# Patient Record
Sex: Female | Born: 1980
Health system: Southern US, Community
[De-identification: ages and names within clinical notes are randomized; demographics above are authoritative.]

## PROBLEM LIST (undated history)

## (undated) DIAGNOSIS — R112 Nausea with vomiting, unspecified: Secondary | ICD-10-CM

## (undated) DIAGNOSIS — E039 Hypothyroidism, unspecified: Secondary | ICD-10-CM

## (undated) DIAGNOSIS — J189 Pneumonia, unspecified organism: Secondary | ICD-10-CM

## (undated) DIAGNOSIS — E079 Disorder of thyroid, unspecified: Secondary | ICD-10-CM

## (undated) DIAGNOSIS — G43909 Migraine, unspecified, not intractable, without status migrainosus: Secondary | ICD-10-CM

## (undated) DIAGNOSIS — K219 Gastro-esophageal reflux disease without esophagitis: Secondary | ICD-10-CM

## (undated) DIAGNOSIS — N809 Endometriosis, unspecified: Secondary | ICD-10-CM

## (undated) DIAGNOSIS — R42 Dizziness and giddiness: Secondary | ICD-10-CM

## (undated) DIAGNOSIS — Z9889 Other specified postprocedural states: Secondary | ICD-10-CM

## (undated) DIAGNOSIS — J45909 Unspecified asthma, uncomplicated: Secondary | ICD-10-CM

## (undated) DIAGNOSIS — E785 Hyperlipidemia, unspecified: Secondary | ICD-10-CM

## (undated) DIAGNOSIS — H8110 Benign paroxysmal vertigo, unspecified ear: Secondary | ICD-10-CM

## (undated) DIAGNOSIS — E7800 Pure hypercholesterolemia, unspecified: Secondary | ICD-10-CM

## (undated) HISTORY — PX: REDUCTION MAMMAPLASTY: SUR839

## (undated) HISTORY — PX: KNEE ARTHROSCOPY: SUR90

## (undated) HISTORY — PX: BREAST REDUCTION SURGERY: SHX8

## (undated) HISTORY — DX: Dizziness and giddiness: R42

## (undated) HISTORY — DX: Hyperlipidemia, unspecified: E78.5

## (undated) HISTORY — DX: Benign paroxysmal vertigo, unspecified ear: H81.10

## (undated) HISTORY — DX: Gastro-esophageal reflux disease without esophagitis: K21.9

## (undated) HISTORY — PX: OTHER SURGICAL HISTORY: SHX169

## (undated) HISTORY — DX: Hypothyroidism, unspecified: E03.9

## (undated) HISTORY — PX: SINUS SURGERY WITH INSTATRAK: SHX5215

## (undated) HISTORY — DX: Migraine, unspecified, not intractable, without status migrainosus: G43.909

## (undated) HISTORY — PX: BILATERAL KNEE ARTHROSCOPY: SUR91

## (undated) HISTORY — PX: TOTAL ABDOMINAL HYSTERECTOMY: SHX209

## (undated) HISTORY — DX: Pneumonia, unspecified organism: J18.9

## (undated) HISTORY — PX: CARPAL TUNNEL RELEASE: SHX101

---

## 2003-07-19 ENCOUNTER — Emergency Department (HOSPITAL_COMMUNITY): Admission: EM | Admit: 2003-07-19 | Discharge: 2003-07-19 | Payer: Self-pay | Admitting: Emergency Medicine

## 2003-10-27 ENCOUNTER — Ambulatory Visit (HOSPITAL_COMMUNITY): Admission: RE | Admit: 2003-10-27 | Discharge: 2003-10-27 | Payer: Self-pay | Admitting: Obstetrics & Gynecology

## 2004-03-27 ENCOUNTER — Emergency Department (HOSPITAL_COMMUNITY): Admission: EM | Admit: 2004-03-27 | Discharge: 2004-03-27 | Payer: Self-pay | Admitting: Emergency Medicine

## 2005-04-24 ENCOUNTER — Ambulatory Visit (HOSPITAL_COMMUNITY): Admission: RE | Admit: 2005-04-24 | Discharge: 2005-04-24 | Payer: Self-pay | Admitting: Family Medicine

## 2005-05-02 ENCOUNTER — Ambulatory Visit (HOSPITAL_COMMUNITY): Admission: RE | Admit: 2005-05-02 | Discharge: 2005-05-02 | Payer: Self-pay | Admitting: Obstetrics & Gynecology

## 2005-06-16 ENCOUNTER — Encounter (HOSPITAL_COMMUNITY): Admission: RE | Admit: 2005-06-16 | Discharge: 2005-07-16 | Payer: Self-pay | Admitting: Endocrinology

## 2006-06-16 ENCOUNTER — Ambulatory Visit (HOSPITAL_BASED_OUTPATIENT_CLINIC_OR_DEPARTMENT_OTHER): Admission: RE | Admit: 2006-06-16 | Discharge: 2006-06-16 | Payer: Self-pay | Admitting: Orthopedic Surgery

## 2006-08-18 ENCOUNTER — Ambulatory Visit (HOSPITAL_BASED_OUTPATIENT_CLINIC_OR_DEPARTMENT_OTHER): Admission: RE | Admit: 2006-08-18 | Discharge: 2006-08-18 | Payer: Self-pay | Admitting: Orthopedic Surgery

## 2007-01-11 ENCOUNTER — Ambulatory Visit (HOSPITAL_COMMUNITY): Admission: RE | Admit: 2007-01-11 | Discharge: 2007-01-11 | Payer: Self-pay | Admitting: Obstetrics and Gynecology

## 2007-01-11 ENCOUNTER — Encounter (INDEPENDENT_AMBULATORY_CARE_PROVIDER_SITE_OTHER): Payer: Self-pay | Admitting: Specialist

## 2007-04-15 ENCOUNTER — Encounter: Payer: Self-pay | Admitting: Obstetrics and Gynecology

## 2007-04-15 ENCOUNTER — Inpatient Hospital Stay (HOSPITAL_COMMUNITY): Admission: RE | Admit: 2007-04-15 | Discharge: 2007-04-17 | Payer: Self-pay | Admitting: Obstetrics and Gynecology

## 2007-09-22 ENCOUNTER — Emergency Department (HOSPITAL_COMMUNITY): Admission: EM | Admit: 2007-09-22 | Discharge: 2007-09-22 | Payer: Self-pay | Admitting: Emergency Medicine

## 2007-09-23 ENCOUNTER — Ambulatory Visit (HOSPITAL_COMMUNITY): Admission: RE | Admit: 2007-09-23 | Discharge: 2007-09-23 | Payer: Self-pay | Admitting: Family Medicine

## 2007-09-27 ENCOUNTER — Ambulatory Visit: Payer: Self-pay | Admitting: Orthopedic Surgery

## 2007-09-27 DIAGNOSIS — M25819 Other specified joint disorders, unspecified shoulder: Secondary | ICD-10-CM | POA: Insufficient documentation

## 2007-09-27 DIAGNOSIS — M7580 Other shoulder lesions, unspecified shoulder: Secondary | ICD-10-CM

## 2008-01-24 ENCOUNTER — Ambulatory Visit (HOSPITAL_COMMUNITY): Admission: RE | Admit: 2008-01-24 | Discharge: 2008-01-24 | Payer: Self-pay | Admitting: Family Medicine

## 2008-05-19 ENCOUNTER — Ambulatory Visit (HOSPITAL_COMMUNITY): Admission: RE | Admit: 2008-05-19 | Discharge: 2008-05-19 | Payer: Self-pay | Admitting: Family Medicine

## 2008-08-29 ENCOUNTER — Encounter (INDEPENDENT_AMBULATORY_CARE_PROVIDER_SITE_OTHER): Payer: Self-pay | Admitting: Plastic Surgery

## 2008-08-29 ENCOUNTER — Ambulatory Visit (HOSPITAL_BASED_OUTPATIENT_CLINIC_OR_DEPARTMENT_OTHER): Admission: RE | Admit: 2008-08-29 | Discharge: 2008-08-29 | Payer: Self-pay | Admitting: Plastic Surgery

## 2008-11-22 ENCOUNTER — Ambulatory Visit (HOSPITAL_COMMUNITY): Admission: RE | Admit: 2008-11-22 | Discharge: 2008-11-22 | Payer: Self-pay | Admitting: Internal Medicine

## 2010-01-30 ENCOUNTER — Ambulatory Visit (HOSPITAL_COMMUNITY): Admission: RE | Admit: 2010-01-30 | Discharge: 2010-01-30 | Payer: Self-pay | Admitting: Internal Medicine

## 2011-01-21 NOTE — H&P (Signed)
[name], [name]                ACCOUNT NO.:  [id number]   MEDICAL RECORD NO.:  [id number]          PATIENT TYPE:  AMB   LOCATION:  DAY                           FACILITY:  [facility]   PHYSICIAN:  Tilda Burrow, M.D. DATE OF BIRTH:  [birth year]   DATE OF ADMISSION:  DATE OF DISCHARGE:  LH                              HISTORY & PHYSICAL   ADMISSION DIAGNOSES:  1. Severe endometriosis with uterosacral ligament involvement.  2. Pelvic pain secondary to endometriosis.   HISTORY OF PRESENT ILLNESS:  This 30 year old female was admitted for  abdominal hysterectomy and bilateral salpingo-oophorectomy. [name] has  been seen in our office for chronic back pain. She recently underwent  laparoscopy in May for pelvic pain and uterine retroversion. She is a  gravida 1, para 1, status post cesarean section. Laparoscopy revealed  severe endometriosis of the cul-de-sac with some effort made to release  the uterosacral ligaments. She was suppressed with Lupron for 2 months.  Consideration was being given for her to stay on the Lupron for 3 months  and then attempt pregnancy. Unfortunately, her pain levels have not  improved on the Lupron, and at this point, she finds the pain too  debilitating to consider delay and attempt at pregnancy. We are,  therefore, as per patient request heading towards an abdominal  hysterectomy with removal and tubes and ovaries and dissection of as  much of the endometriosis of the cul-de-sac as possible. Dr. Rito Ehrlich is  being called to assist to place ureteral stent before the procedure to  try to address the safety issues regarding the ureters. The patient will  have a bowel prep the day before to reduce bowel contents. The patient  is aware that it may not be possible to remove all of the endometriosis  and that residual discomfort is a possibility. Ovarian preservation is  considered undesirable due to the lack of recurrent persistent  endometriosis pain.   PAST  MEDICAL HISTORY:  Hypothyroidism managed by Dr. Morayati__________  .   SURGICAL HISTORY:  1. Cesarean section x1.  2. Laparoscopy on May 2008.   ALLERGIES:  BENADRYL, SUDAFED, DARVOCET, VICODIN, VOLTAREN, CODEINE AND  LODINE.   GENERAL EXAM:  Shows an average-weight Caucasian female, weight 160.  Blood pressure 120/62, pulse 72.  Pupils are equal, round, and reactive.  NECK:  Supple.  CHEST:  Clear to auscultation.  ABDOMEN:  Nontender with well-healed incision. There is some suprapubic  discomfort on palpation. Bimanual exam shows a retroverted uterus with  tenderness and nodularity in the cul-de-sac, even though she is on  Lupron.  Vaginal tissues are atrophic.   IMPRESSION:  1. Severe endometriosis.  2. Pelvic pain secondary to endometriosis.   PLAN:  TAH/BSO after placement of ureteral stents on April 15, 2007.      Tilda Burrow, M.D.  Electronically Signed     JVF/MEDQ  D:  04/14/2007  T:  04/14/2007  Job:  [id number]   cc:   [name], M.D.  Fax: [phone]

## 2011-01-21 NOTE — Op Note (Signed)
[name], [name]                ACCOUNT NO.:  [id number]   MEDICAL RECORD NO.:  [id number]          PATIENT TYPE:  INP   LOCATION:  A340                          FACILITY:  [facility]   PHYSICIAN:  Tilda Burrow, M.D. DATE OF BIRTH:  1980-10-01   DATE OF PROCEDURE:  DATE OF DISCHARGE:                               OPERATIVE REPORT   PREOPERATIVE DIAGNOSIS:  Pelvic pain, severe endometriosis with  uterosacral ligament involvement.   POSTOPERATIVE DIAGNOSIS:  Pelvic pain, severe endometriosis with  uterosacral ligament involvement.   PROCEDURE:  Total abdominal hysterectomy, bilateral salpingo-  oophorectomy.  Additionally, ureteral stent placement by Dr. Rito Ehrlich,  dictated elsewhere.   SURGEON:  Tilda Burrow, M.D.   ASSISTANT:  Dr. Rito Ehrlich.   ANESTHESIA:  General.   COMPLICATIONS:  None.   FINDINGS:  Fibrotic endometriosis, particularly of the left uterosacral  ligament.   DESCRIPTION OF PROCEDURE:  Patient was taken to the operating room,  prepped and draped for combined abdominal and vaginal procedure.  Dr.  Rito Ehrlich proceeded with ureteral stents, as per his dictation.   The patient's legs were placed down in a low lithotomy position, and  Pfannenstiel incision repeated.  The old C-section scar was widely  excised, excising a 6 cm wide x 20 cm long ellipse of skin and fatty  tissue.  The remaining subcu fatty tissue was transected, and the fascia  identified and opened in a standard Pfannenstiel technique.  There was a  lot of fibrous adhesions from the fascia to the rectus muscles.  Careful  hemostasis was achieved.  The peritoneal cavity was entered carefully in  the midline using hemostats to elevate the peritoneum and Metzenbaum  scissors to dissect.  The peritoneal cavity was once entered, and there  were no adhesions to the anterior abdominal wall.  Bowel was normal in  appearance on its surfaces.   Plastic abdominal wall retractor was positioned in  placed, and the  pelvis emptied of bowel contents while elevating the bowel and using  moistened laps.  The uterus could be elevated and was a small uterus  with atrophic ovaries while she is on Lupron.  The left uterosacral  ligament was particularly well involved with endometriosis, but the rest  of the pelvis appeared grossly normal.  The ureters could be palpated  out of the surgical area, but this was about 1.5 cm on the left and a  little more on the right.  Round ligaments were taken down, ligated,  transected, and found to be hemostatic.  The bladder flap was developed  anteriorly without difficulty, and the correct plane identified.  There  was some vascularity in the vesicouterine reflection of the peritoneum  and point cautery was used as carefully as necessary to achieve  hemostasis in this area.  The infundibulopelvic ligaments were isolated  on either side, being careful to palpate the ureters, as we did so and  then transected using 0 chromic suture ligature.  The broad ligament was  skeletonized down to the uterine vessels, transected using curved Heaney  clamp, Mayo scissors transection, and 0 chromic suture  ligature.  The  upper and lower cardinal ligaments were serially clamped, cut, and  suture ligated using straight Heaney clamps, Mayo scissor transection,  and 0 chromic suture ligature.  Once the level of the cervix was felt  opened and reached, and the bladder flap had been placed well out of the  way, an anterior stab incision in the cervical vaginal fornix was  achieved, and then Kocher clamps were used to grasp the vaginal cuff  edges as we amputated the cervix off the vaginal cuff.  A posterior tuck  was made in the posterior rim of the vaginal cuff tissue to give an  effective lengthening of the vagina, then the remainder of the cuff was  closed by placing interrupted 0 chromic sutures across the cuff  transversely.  Hemostasis was quite good at the completion  of this.  The  cuff apex was attached to the lower cardinal ligaments on either side by  this process.   There was a small bit of hemostasis that required additional suturing x1  on the left side of the vesicouterine reflection of peritoneum.  This  was achieved with a single suture of 2-0 chromic.  The ureter again and  the bladder were serially inspected prior to and after placement of the  suture for confirmation of its position.   The peritoneum was loosely reapproximated with interrupted 2-0 chromic  x3 across the pelvic floor after irrigation and inspection of the pelvis  for hemostasis.   The uterosacral ligament on the left side required special attention.  It was densely adherent to the cuff and after we had identified the left  ureter and the surface of the bowel and convinced ourselves that both  were well out of harm's way, the uterosacral ligament on this side was  trimmed from the cuff enough to march down and remove approximately 1.5  cm long x 1 cm x 5 mm thick area of fibrotic tissue from the surface of  the uterosacral ligament, improving mobility on this side and throughout  the process.  We maintained appreciation of the bowel and ureter to be  sure that we did not suspect any problems.  The peritoneum over this  defect was closed side to side with good hemostasis using 2-0 chromic  interrupted sutures.   We then proceeded with the peritoneal closure mentioned earlier.   Laparotomy equipment was removed.  The pelvis confirmed once again it  was adequately hemostatic.  The anterior peritoneum was closed with  running 2-0 chromic.  Rectus muscles were naturally closed enough  together that no suturing was required.  The fascia was then closed in a  running 0 Vicryl fashion with good tissue edges approximating.  The  subcu fatty tissues were irrigated with saline solution with two layers  of interrupted 2-0 plain sutures pulling tissues together and staple  closure  of the skin completing the procedure.  The patient tolerated the  procedure well and went to the recovery room in good condition.  Sponge  and needle counts are correct.   ADDENDUM:  A Jackson-Pratt drain was placed in the subcu space and  allowed to exit through the left side of the incision.  Additionally, at  the end of the case, the ureteral stents replaced by Dr. Rito Ehrlich  earlier were removed by me and were visually inspected and appeared to  be intact.      Tilda Burrow, M.D.  Electronically Signed     JVF/MEDQ  D:  04/15/2007  T:  04/15/2007  Job:  [id number]   cc:   Dennie Maizes, M.D.  Fax: [phone]

## 2011-01-21 NOTE — Op Note (Signed)
[name], [name]                ACCOUNT NO.:  [id number]   MEDICAL RECORD NO.:  [id number]          PATIENT TYPE:  INP   LOCATION:  A340                          FACILITY:  [facility]   PHYSICIAN:  Dennie Maizes, M.D.   DATE OF BIRTH:  05/13/1981   DATE OF PROCEDURE:  04/15/2007  DATE OF DISCHARGE:                               OPERATIVE REPORT   PREOPERATIVE DIAGNOSIS:  Pelvic pain, extensive endometriosis.   POSTOPERATIVE DIAGNOSIS:  Pelvic pain, extensive endometriosis.   OPERATIVE PROCEDURE:  Cystoscopy and bilateral ureteral catheter  insertion.   ANESTHESIA:  General.   SURGEON:  Dennie Maizes, M.D.   COMPLICATIONS:  None.   ESTIMATED BLOOD LOSS:  None.   DRAINS:  16-French Foley catheter in the bladder, 5-French ureteral  catheters x2.   INDICATIONS FOR PROCEDURE:  This 30 year old female had pelvic pain  associated with extensive endometriosis.  She had a prior history of C-  section.  Dr. Emelda Fear planned to do total abdominal hysterectomy with  bilateral salpingo-oophorectomy.  Dr. Emelda Fear asked me to do cystoscopy  and place bilateral ureteral catheters for intraoperative localization  of the ureters.   DESCRIPTION OF PROCEDURE:  General anesthesia was induced and the  patient was placed on the OR table in the dorsal lithotomy position.  The lower abdomen and genitalia were prepped and draped in a sterile  fashion.  Urethral meatal stenosis was noted.  Urethral dilation was  done up to 28-French.  Cystoscopy was done with a 25-French scope.  Appearance of bladder was normal.  The trigone ureteral orifice and  bladder mucosa were unremarkable.  A 5-French whistle-tip catheter was  then placed on the left side up to level of 22 cm without any  difficulty.  A second catheter was placed on the right side.  The  cystoscope was removed.  A 16-French Foley catheter was inserted into  the bladder.  The ureteral catheter was affixed to the Foley catheter  using a silk  tie and  tape.  Dr. Emelda Fear then proceeded with total abdominal hysterectomy,  bilateral salpingo-oophorectomy.  I assisted him for this procedure.   It is planed to remove the ureteral catheters at the conclusion  operation.      Dennie Maizes, M.D.  Electronically Signed     SK/MEDQ  D:  04/15/2007  T:  04/15/2007  Job:  [id number]   cc:   Tilda Burrow, M.D.  Fax: [phone]

## 2011-01-21 NOTE — Discharge Summary (Signed)
[name], [name]                ACCOUNT NO.:  [id number]   MEDICAL RECORD NO.:  [id number]          PATIENT TYPE:  INP   LOCATION:  A340                          FACILITY:  [facility]   PHYSICIAN:  Lazaro Arms, M.D.   DATE OF BIRTH:  [birth year]   DATE OF ADMISSION:  04/15/2007  DATE OF DISCHARGE:  08/09/2008LH                               DISCHARGE SUMMARY   DISCHARGE DIAGNOSES:  1. Status post total abdominal hysterectomy and bilateral salpingo-      oophorectomy.  2. Severe endometriosis.  3. Unremarkable postoperative course.   PROCEDURE:  Abdominal hysterectomy with removal of both tubes and  ovaries.   Please refer to the History and Physical for details of admission to the  hospital as well as the operative report.   POSTOPERATIVE COURSE.:  The patient did well.  She tolerated clear  liquids to regular diet.  She was without symptoms.  Her abdominal exam  was benign.  Her incision was clean, dry and intact.  The Jackson-Pratt  drain was putting out serosanguineous fluid minimally. She was  ambulating without difficulty and tolerating oral pain medicine.  She  had a small amount of nausea for which she was taking some oral  Phenergan but otherwise doing well.  Her postoperative hemoglobin and  hematocrit were 11.5 and 33.3, respectively, which was appropriate  minimal drop. She was discharged to home on the morning of postoperative  day #2 in good and stable condition.  She will follow up in the office  as scheduled the following Wednesday.  She was given prescriptions for:  1. Percocet 5/325, #40.  2. Motrin 800 mg, #20.  3. Phenergan 25 mg, #12.  4. She will continue her other routine medications at home.   Pathology report is pending.   She is given instructions on precautions with p.r.n. return prior to  that time.      Lazaro Arms, M.D.  Electronically Signed     LHE/MEDQ  D:  04/17/2007  T:  04/18/2007  Job:  [id number]

## 2011-01-21 NOTE — Op Note (Signed)
[name], [name]                ACCOUNT NO.:  [id number]   MEDICAL RECORD NO.:  [id number]          PATIENT TYPE:  AMB   LOCATION:  DSC                          FACILITY:  [facility]   PHYSICIAN:  Mary Contogiannis, M.D.DATE OF BIRTH:  July 30, 1981   DATE OF PROCEDURE:  08/29/2008  DATE OF DISCHARGE:                               OPERATIVE REPORT   PREOPERATIVE DIAGNOSIS:  Bilateral macromastia.   POSTOPERATIVE DIAGNOSIS:  Bilateral macromastia.   PROCEDURE:  Bilateral reduction mammoplasty.   ATTENDING SURGEON:  Brantley Persons, MD   ANESTHESIA:  General.   ANESTHESIOLOGIST:  Dr. Sondra Come.   ESTIMATED BLOOD LOSS:  200 mL.   FLUID REPLACEMENT:  2900 mL crystalloid.   URINE OUTPUT:  1000 mL.   COMPLICATIONS:  None.   INDICATIONS FOR PROCEDURE:  The patient is a 30 year old Caucasian  female, who has bilateral macromastia that is clinically symptomatic.  She presents to undergo bilateral reduction mammoplasties.   PROCEDURE:  The patient was marked in the preop holding area in the  pattern of Wise for the future bilateral reduction mammoplasties.  She  was then taken back to the OR and placed on the table in a supine  position.  After adequate general endotracheal anesthesia was obtained,  the patient's chest was prepped with Technicare and draped in sterile  fashion.  The base of the breasts were then injected with 1% lidocaine  with epinephrine.  After adequate hemostasis and anesthesia taken  effect, the procedure was begun.   Both of the breast reductions were performed in the following similar  manner.  The nipple-areolar complex was marked with the 45-mm nipple  marker.  This was then incised and the skin was de-epithelialized around  the nipple-areolar complex down to the inframammary crease in inferior  pedicle pattern.  Next, the medial, superior, and lateral skin flaps  were elevated down to the chest wall.  The excess fat and glandular  tissue were removed from the  inferior pedicle.  The nipple-areolar  complex was examined and found to be pink and viable.  The wound was  then examined and meticulous hemostasis was obtained with the Bovie  electrocautery.  A #10 JP flat fully-fluted drain was placed into the  wound.  The skin flaps were brought together in the inverted T junction  with a 2-0 Prolene suture.  The incisions were stapled for temporary  closure.  The breasts compared and found to have good shape and  symmetry.  The incisions were then closed from the medial aspect of the  JP drain to the medial aspect of the [facility] incision by first placing a few  3-0 Monocryl sutures in the dermal layer and then both the dermal and  cuticular layers were closed in a single layer using a 2-0 Quill PDO  barbed suture.  Lateral to the JP drain, incision was closed using 3-0  Monocryl in the dermal layer followed by a 3-0 Monocryl running  intracuticular stitch on the skin.   The patient was then placed in the upright position.  The future  location of the nipple-areolar complexes were  marked on both breast  mounds using the 45-mm nipple marker.  She was then placed back in the  recumbent position.    Both of the nipple-areolar complexes were then brought out onto the  breast mounds in the following similar manner.  The skin was incised as  marked and a tissue removed full thickness through the skin and soft  tissues.  The nipple-areolar complex was examined, found to be pink and  viable, then brought out through this aperture and sewn in place using 4-  0 Monocryl in the dermal layer followed by a 5-0 Monocryl suture in the  cuticular layer.  The vertical limb of the Wise pattern was closed using  3-0 Monocryl in the dermal layer followed by a 5-0 Monocryl suture for  the cuticular layer that was brought down from the nipple-areolar  complex closure.  The JP drain was sewn in place using 3-0 nylon suture.  The base of the breasts, skin, and soft tissues  were then infiltrated  with 0.5% Marcaine with epinephrine to provide a postoperative surgical  block.  The incisions were dressed with Benzoin and Steri-Strips and the  nipples additionally with bacitracin ointment and Adaptic.  A 4x4s were  then placed over the incisions and the patient was placed into a light  postoperative support bra.  There were no complications.  The patient  tolerated procedure well.  The final needle and sponge counts report to  be correct at the end of the case.   The patient was then awakened from the general anesthesia and taken to  the recovery room in stable condition.  She was then recovered without  complications.  The patient decided she would rather go home than stay  overnight, this was appropriate to me.  Both the patient and her husband  were given proper postoperative wound care instructions including care  of the JP drains.  The patient was then discharged home in the care of  her husband in stable condition.  Followup appointment will be in a  couple of days in the office.           ______________________________  Brantley Persons, M.D.     MC/MEDQ  D:  08/29/2008  T:  08/30/2008  Job:  [id number]

## 2011-01-24 NOTE — Op Note (Signed)
[name], [name]                ACCOUNT NO.:  [id number]   MEDICAL RECORD NO.:  [id number]          PATIENT TYPE:  AMB   LOCATION:  DAY                           FACILITY:  [facility]   PHYSICIAN:  Tilda Burrow, M.D. DATE OF BIRTH:  Jun 19, 1981   DATE OF PROCEDURE:  01/11/2007  DATE OF DISCHARGE:                               OPERATIVE REPORT   PREOPERATIVE DIAGNOSIS:  Pelvic pain, uterine retroversion.   POSTOPERATIVE DIAGNOSIS:  Pelvic pain, uterine retroversion,  endometriosis __________.   OPERATION PERFORMED:  Diagnostic laparoscopy, peritoneal biopsies.   SURGEON:  Tilda Burrow, M.D.   ASSISTANT:  None.   ANESTHESIA:  General.   COMPLICATIONS:  None.   FINDINGS:  Multiple tiny 1 to 2 mm points of dark endometriosis,  particularly involving the left uterosacral ligament and right  uterosacral ligament to a lesser degree with several small implants on  the bladder flap.  No visible endometriosis involving either tube or  either ovary.   DESCRIPTION OF PROCEDURE:  The patient was taken to the operating room,  prepped and draped in the usual fashion for the combined abdominal and  vaginal procedure with uterine manipulator in position.  Foley catheter  in place using hypoallergenic equipment, and then we proceeded with  laparoscopy.  After the patient was prepped and draped, an  infraumbilical vertical 1 cm skin incision was made through the  abdominal wall.  The sacral promontory was palpable through the  abdominal wall and given the patient's slim body habitus, we were  particularly careful to retract the abdominal wall caudad to ensure that  the peritoneal entry of the Veress needle was into the pelvic bowl.  Water droplet test confirmed intraperitoneal location and the  pneumoperitoneum was easily achieved under 10 mmHg pressure.  The 10 mm  umbilical laparoscopic trocar was introduced under direct visualization  and the pelvis inspected and no evidence of  bleeding or trauma  suspected.  The suprapubic trocar was introduced under direct  visualization.  The patient was placed in the Trendelenburg position and  the pelvis was inspected.  After noticing the bowel was grossly normal  without abnormalities, the bladder flap was found to have multiple small  1 mm dark implants of endometriosis over its surface.  The old cesarean  section scar showed some retraction and fibrosis either representing  postsurgical changes or endometriosis.  Looking behind the uterus, there  was a small amount of adhesions just inside the left uterosacral  ligament, that was causing some traction on the peritoneum, and then the  left uterosacral ligament was particularly involved in a dark fibrotic  area with surrounding peritoneal thickening.  The right uterosacral  ligament was less involved.  Ureters could be identified at the pelvic  brim and appeared to dive into the sidewall out of the way of the  endometriosis.  The left uterosacral ligament was then addressed first  with the suprapubic trocar in the third trocar site to the right lower  quadrant used to identify the fibrotic endometriosis on the surface of  the uterosacral.  A strip of peritoneum  was removed just medial to the  uterosacral and then peritoneal surface over the uterosacral ligament on  the left side elevated and trimmed, improving the laxity on this left  side.  Specimen was taken off.  There was no suspected injury to  adjacent organs or retroperitoneal structures.  Attempts to biopsy the  dome of the bladder were not particularly successful, but a releasing  incision could be made on the side of each endometrial implant to avoid  the tendencies toward retraction.  The patient tolerated the procedure  well. Pelvis was irrigated, approximately 200 mL saline left in the  abdomen, abdomen deflated, the umbilical site closed at the fascial  level with 0-0 Vicryl.  All three sites closed using 3-0  Vicryl  subcutaneously.  The patient then was allowed to go to recovery room in  good condition.  Sponge and needle counts were correct.   ADDENDUM:  The patient will be placed on Lupron post surgically until  pregnancy is desired.      Tilda Burrow, M.D.  Electronically Signed     JVF/MEDQ  D:  01/11/2007  T:  01/11/2007  Job:  [id number]

## 2011-01-24 NOTE — H&P (Signed)
NAMEJADORE, MCGUFFIN                ACCOUNT NO.:  [id number]   MEDICAL RECORD NO.:  [id number]           PATIENT TYPE:  AMB   LOCATION:  DAY                           FACILITY:  [facility]   PHYSICIAN:  Tilda Burrow, M.D. DATE OF BIRTH:  27-Jul-1981   DATE OF ADMISSION:  01/11/2007  DATE OF DISCHARGE:  LH                              HISTORY & PHYSICAL   ADMISSION DIAGNOSES:  1. Chronic pelvic pain.  2. Uterine retroversion.  3. Chronic anovulation.  4. Hypothyroidism, controlled.  5. Scheduled for laparoscopy.   This 30 year old female, gravida 1, para 1, status post cesarean  section, is admitted for diagnostic laparoscopy to evaluate pelvic  discomfort.  She has had a history of irregular cycles and delivered in  September 2000 after requiring clomiphene for ovulation induction.  In  2001 she had cervical dysplasia that was mild in nature and treated with  cryocautery and then subsequently conization.  The cervix is somewhat  stenotic.  She has had a cesarean delivery.  More currently, she has a  chronic left-sided ache with sleep, intercourse or normal activity such  as bowling or normal activity.  She has had cycles as long as 52 days  from the anovulation.  The plan for surgery is based on the fact that  she has a severely retroverted, retroflexed uterus and the left ovary  behind it is a chronic source of discomfort.  Vaginal ultrasound  reproduces the pain when the uterus or cervix are contacted and  particularly uncomfortable is the left ovary on the months when she  ovulates on that side.  The right side very rarely causes pain.  Today's  ultrasound confirms again that she has a small cyst on the left ovary.  When it appeared last month it was on the right side, so normal ovarian  function is occurring at the current time.  Currently she finds the pain  so incapacitating that we are doing a laparoscopy to evaluate it, look  for adhesions or potentially correctable problems.   Uterine suspension  has been discussed as a treatment that will sometimes assist pelvic  pain.  No absolute guarantees can be given, with both successes and  failures reported in the past under my care with other patients  discussed with Leta Jungling.  Plans are for diagnostic laparoscopy, probable  uterine suspension, and evaluation of the adnexa.   FUTURE CHILDBEARING IS DESIRED, SO NO PLANS AT SURGICAL REMOVAL OF  ORGANS ARE PLANNED.   PAST MEDICAL HISTORY:  Hypothyroidism managed by Dr. Patrecia Pace, currently  on thyroid medication.   SURGICAL HISTORY:  Cesarean section.   GYN HISTORY:  Notable for mildly abnormal Pap smear, status post  cryocautery and LEEP with negative recent Pap smear.  2001, GC and  Chlamydia are negative.   SOCIAL HISTORY:  Within normal limits.  Married.  Stable home  relationship.  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  Former respiratory  therapist.  No alcohol and no drug use.  No cigarettes.   ALLERGIES:  BENADRYL causes rash.  DARVOCET and other pain medicines,  NSAIDs, NAPROSYN and VICODIN, cause  nausea, vomiting and diarrhea.  SUDAFED causes minor reaction.   MEDICATIONS:  1. Armour Thyroid 30 mg tablets daily.  2. Midrin 325/65/100 mg tablet p.r.n. migraines.  3. Nexium 40 mg p.r.n. heartburn.   PHYSICAL EXAMINATION:  GENERAL:  A slim, somber Caucasian female, alert  and oriented x3.  HEENT:  Pupils equal, round, and reactive.  NECK:  Supple.  CHEST:  Normal.  CARDIOVASCULAR:  Normal.  ABDOMEN:  Well-healed lower abdomen.  PELVIC:  External genitalia within normal limits.  Retroverted,  retroflexed uterus with tenderness on cervical contact and left adnexal  contact with ultrasound.   PLAN:  Diagnostic laparoscopy, possible uterine suspension Jan 11, 2007.      Tilda Burrow, M.D.  Electronically Signed     JVF/MEDQ  D:  01/06/2007  T:  01/06/2007  Job:  [id number]

## 2011-01-24 NOTE — Op Note (Signed)
[name], [name]                ACCOUNT NO.:  [id number]   MEDICAL RECORD NO.:  [id number]          PATIENT TYPE:  AMB   LOCATION:  [facility]                         FACILITY:  [facility]   PHYSICIAN:  Deidre Ala, M.D.    DATE OF BIRTH:  1981-02-29   DATE OF PROCEDURE:  08/18/2006  DATE OF DISCHARGE:                               OPERATIVE REPORT   PREOPERATIVE DIAGNOSIS:  Right carpal tunnel syndrome.   POSTOPERATIVE DIAGNOSIS:  Right carpal tunnel syndrome.   PROCEDURE:  Right carpal tunnel release.   SURGEON:  1. Charlesetta Shanks, M.D.   ASSISTANT:  Clarene Reamer, [facility].   ANESTHESIA:  General with LMA.   CULTURES:  None.   DRAINS:  None.   ESTIMATED BLOOD LOSS:  Minimal.   TOURNIQUET TIME:  15 minutes.   PATHOLOGIC FINDINGS AND HISTORY:  [name] is a 30 year old female with  chronic bilateral carpal tunnel syndrome from excessive writing and  typing of 13 years duration, started when she finished high school  early.  She had positive nerve conduction studies in September by Dr.  Regino Schultze.  I did a left carpal tunnel release June 16, 2006, two months  ago and she has done well.  She now desired procedure on the right side.  At surgery, classic findings were noted with hourglassing of the median  nerve, good release obtained.   PROCEDURE:  With adequate anesthesia obtained using LMA technique, 1 g  Ancef given IV prophylaxis, the patient was placed in the supine  position.  The right upper extremity was prepped from the fingertips to  the upper forearm in a standard fashion.  After standard prepping and  draping, Esmarch exsanguination was used.  The tourniquet was let up to  250 mmHg.  An incision was then made at the base of the palm, at the  thumb flexion crease, to the distal palmar flexion crease.  Incision was  deepened sharply with the knife and hemostasis obtained using the Bovie  electrocoagulator.  Dissection was carried down to the palmar fascia and  transverse  carpal ligament.  Freer elevator was then placed underneath  these structures and cut down upon with a 64 Beaver blade to expose the  nerve.  The nerve was then gently neurolysed with all branches traced  distal including the motor branch and then released the nerve of the  distal wrist retinaculum on the ulnar side of the nerve up into the  forearm.  Part of the more proximal transverse carpal ligament was  excised.  The wound was then irrigated and closed with running and  interrupted 4-0 nylon suture and a bulky sterile compressive dressing  was applied with volar plaster splint in slight  cock-up.  The patient having tolerated procedure well, was awakened,  taken to the recovery room in satisfactory condition to be discharged  per outpatient routine, given Vicodin for pain and told to call the  office for appointment for recheck on Friday.           ______________________________  V. Charlesetta Shanks, M.D.     VEP/MEDQ  D:  08/18/2006  T:  08/18/2006  Job:  [id number]

## 2011-01-24 NOTE — Op Note (Signed)
NAMESHAMIA, UPPAL                ACCOUNT NO.:  [id number]   MEDICAL RECORD NO.:  [id number]          PATIENT TYPE:  AMB   LOCATION:  [facility]                         [facility]ACILITY:  [facility]   PHYSICIAN:  Deidre Ala, M.D.    DATE OF BIRTH:  1981-02-04   DATE OF PROCEDURE:  06/16/2006  DATE OF DISCHARGE:                                 OPERATIVE REPORT   PREOPERATIVE DIAGNOSIS:  Left carpal tunnel syndrome.   POSTOPERATIVE DIAGNOSIS:  Left carpal tunnel syndrome.   PROCEDURE:  Left carpal tunnel release.   SURGEON:  1. Charlesetta Shanks, M.D.   ASSISTANT:  __________, P.A.-C.   ANESTHESIA:  General with LMA.   CULTURES:  None.   DRAINS:  None.   ESTIMATED BLOOD LOSS:  Minimal.   TOURNIQUET TIME:  Was 15 minutes.   PATHOLOGIC FINDINGS/HISTORY:  Verlon presented to me May 15, 2006, with  a 13-year-history of bilateral carpal tunnel syndrome.  Nerve conduction  studies in the past in the early part of the 2000's, but she could not get  a hold of those.  Apparently she was precocious and wanted to finish high  school early.  She finished at age 87 and did a lot of typing and writing  early on.  She continued to have difficulty.  She is a Futures trader.  She has  numbness and tingling in the median distribution, left greater than right,  wearing wrist splints.  We did get another nerve conduction test, which was  positive per Dr. Thelma Barge P. Modesto Charon.  She elected to proceed with the left side  first.   At surgery classic findings were noted with hourglass constriction.  A good  release was obtained.   DESCRIPTION OF PROCEDURE:  With adequate anesthesia obtained using LMA  technique, 1 gram of Ancef given IV prophylaxis.  The patient was placed in  the supine position.  The left upper extremity was prepped from the finger  tips to the upper forearm in the standard fashion.  After a standard  prepping and draping, Esmarch exsanguination was used.  The tourniquet was  let up to 250 mmHg.  A  skin incision was then made at the base of the palm  in the longitudinal thumb flexion crease, to the distal wrist flexion  crease.  The incision was deepened sharply and adequate hemostasis obtained  using the Bovie electro-coagulator.  Dissection was carried down with loupe  magnification to the palmar fascia.  I then placed a Therapist, nutritional  underneath it to protect the nerve, and cut down upon it and through it with  a #64 Beaver blade, and the transverse carpal ligament, thus exposing the  median nerve.  Careful neurolysis was then carried out with scissors,  tenotomy type.  I then released the distal wrist flexor retinaculum on the  ulnar side of the nerve underneath the skin in the distal forearm.  I traced  all branches distally, including the motor branch.  A volar epi-neurectomy  was carried out.  Irrigation was then performed, and the wound was closed  with running and interrupted #4-0 nylon  sutures.  A bulky sterile  compressive dressing was applied with volar plastic splint in a slight cock-  up.  Marcaine 0.5% was injected in and about the wound.   The patient, then having tolerated the procedure well, was taken to the  recovery room in satisfactory condition.   DISPOSITION:  She will be discharged per outpatient routine.  Given Vicodin  for pain and told to call the office for a recheck on Friday.           ______________________________  V. Charlesetta Shanks, M.D.     VEP/MEDQ  D:  06/16/2006  T:  06/16/2006  Job:  [id number]

## 2011-03-26 ENCOUNTER — Other Ambulatory Visit: Payer: Self-pay | Admitting: Obstetrics & Gynecology

## 2011-03-26 DIAGNOSIS — R922 Inconclusive mammogram: Secondary | ICD-10-CM

## 2011-03-26 DIAGNOSIS — E049 Nontoxic goiter, unspecified: Secondary | ICD-10-CM

## 2011-03-28 ENCOUNTER — Ambulatory Visit (HOSPITAL_COMMUNITY)
Admission: RE | Admit: 2011-03-28 | Discharge: 2011-03-28 | Disposition: A | Discharge status: Home / Self Care | Payer: BC Managed Care – PPO | Source: Ambulatory Visit | Attending: Obstetrics & Gynecology | Admitting: Obstetrics & Gynecology

## 2011-03-28 DIAGNOSIS — E049 Nontoxic goiter, unspecified: Secondary | ICD-10-CM

## 2011-04-16 ENCOUNTER — Ambulatory Visit (HOSPITAL_COMMUNITY)
Admission: RE | Admit: 2011-04-16 | Discharge: 2011-04-16 | Disposition: A | Discharge status: Home / Self Care | Payer: BC Managed Care – PPO | Source: Ambulatory Visit | Attending: Obstetrics & Gynecology | Admitting: Obstetrics & Gynecology

## 2011-04-16 DIAGNOSIS — R922 Inconclusive mammogram: Secondary | ICD-10-CM

## 2011-04-16 DIAGNOSIS — N63 Unspecified lump in unspecified breast: Secondary | ICD-10-CM | POA: Insufficient documentation

## 2011-06-06 ENCOUNTER — Encounter (INDEPENDENT_AMBULATORY_CARE_PROVIDER_SITE_OTHER): Payer: Self-pay | Admitting: *Deleted

## 2011-06-13 LAB — POCT I-STAT, CHEM 8
BUN: 17 mg/dL (ref 6–23)
Calcium, Ion: 1.15 mmol/L (ref 1.12–1.32)
Chloride: 99 mEq/L (ref 96–112)
Creatinine, Ser: 1.2 mg/dL (ref 0.4–1.2)
Glucose, Bld: 91 mg/dL (ref 70–99)
HCT: 47 % — ABNORMAL HIGH (ref 36.0–46.0)
Hemoglobin: 16 g/dL — ABNORMAL HIGH (ref 12.0–15.0)
Potassium: 3.6 mEq/L (ref 3.5–5.1)
Sodium: 140 mEq/L (ref 135–145)
TCO2: 33 mmol/L (ref 0–100)

## 2011-06-23 LAB — CROSSMATCH
ABO/RH(D): O POS
Antibody Screen: NEGATIVE

## 2011-06-23 LAB — CBC
HCT: 33.3 — ABNORMAL LOW
HCT: 39.6
Hemoglobin: 13.6
MCHC: 34.2
MCHC: 34.5
MCV: 89.5
MCV: 90.2
Platelets: 212
Platelets: 243
RBC: 4.43
RDW: 12.4
RDW: 12.6
WBC: 7.6

## 2011-06-23 LAB — BASIC METABOLIC PANEL
BUN: 15
CO2: 31
Calcium: 9.1
Chloride: 105
Creatinine, Ser: 0.69
GFR calc Af Amer: >60
GFR calc non Af Amer: >60
Glucose, Bld: 86
Potassium: 4.1
Sodium: 139

## 2011-06-23 LAB — ABO/RH: ABO/RH(D): O POS

## 2011-06-23 LAB — DIFFERENTIAL
Basophils Absolute: 0
Basophils Relative: 0
Eosinophils Absolute: 0
Eosinophils Relative: 0

## 2011-06-23 LAB — HCG, QUANTITATIVE, PREGNANCY: hCG, Beta Chain, Quant, S: <2

## 2011-06-25 ENCOUNTER — Ambulatory Visit (INDEPENDENT_AMBULATORY_CARE_PROVIDER_SITE_OTHER): Payer: BC Managed Care – PPO | Admitting: Internal Medicine

## 2011-06-25 ENCOUNTER — Encounter (INDEPENDENT_AMBULATORY_CARE_PROVIDER_SITE_OTHER): Payer: Self-pay | Admitting: Internal Medicine

## 2011-06-25 ENCOUNTER — Encounter (INDEPENDENT_AMBULATORY_CARE_PROVIDER_SITE_OTHER): Payer: Self-pay | Admitting: *Deleted

## 2011-06-25 ENCOUNTER — Other Ambulatory Visit (INDEPENDENT_AMBULATORY_CARE_PROVIDER_SITE_OTHER): Payer: Self-pay | Admitting: *Deleted

## 2011-06-25 VITALS — BP 100/70 | HR 72 | Temp 97.9°F | Ht 63.0 in | Wt 179.0 lb

## 2011-06-25 DIAGNOSIS — E785 Hyperlipidemia, unspecified: Secondary | ICD-10-CM

## 2011-06-25 DIAGNOSIS — K219 Gastro-esophageal reflux disease without esophagitis: Secondary | ICD-10-CM

## 2011-06-25 NOTE — Progress Notes (Signed)
Subjective:     Patient ID: [name], female   DOB: [birth year], 30 y.o.   MRN: [id number]  HPI [name] is a 30 yr old female referred to our office for refractory GERD. She has had acid reflux for 15-18 yrs.  She says Dr. Esmeralda Arthur treated her about 18 yrs ago for abdominal pain. She was treated with Tagamet and Carafate. She tells me that she is burning all the time in her esophagus. She is constantly clearing her throat. She has frequent nausea.  She has acid reflux about every day.  Early in the am she will have a nasty taste in her mouth. She does not have problems if she is sitting in a recliner, but if she goes to bed she can feel the acid reflux. Her Protonix was doubled in June to BID.  She says BID has helped but she is still burning.  She is presently on Ceftin for a sorethroat, though her strep screen was negative.  Her appetite is good. No weight loss. She has actually gained weight.  No dysphagia. She does have epigastric tenderness. She has a BM about daily. No rectal bleeding or melena.  She is avoiding spicy foods.   .Review of Systems see hpi Current Outpatient Prescriptions  Medication Sig Dispense Refill  . albuterol (PROVENTIL HFA;VENTOLIN HFA) 108 (90 BASE) MCG/ACT inhaler Inhale 2 puffs into the lungs every 6 (six) hours as needed.        . cefUROXime (CEFTIN) 500 MG tablet Take 500 mg by mouth 2 (two) times daily.        Marland Kitchen EPINEPHrine (EPI-PEN) 0.3 mg/0.3 mL DEVI Inject 0.3 mg into the muscle as needed.        Marland Kitchen estradiol (ESTRACE) 0.1 MG/GM vaginal cream Place 2 g vaginally once a week.        . isometheptene-acetaminophen-dichloralphenazone (MIDRIN) 65-325-100 MG capsule Take 1 capsule by mouth as needed.        . pantoprazole (PROTONIX) 40 MG tablet Take 40 mg by mouth 2 (two) times daily before a meal.        . simvastatin (ZOCOR) 10 MG tablet Take 10 mg by mouth at bedtime.        . sucralfate (CARAFATE) 1 GM/10ML suspension Take 1 g by mouth 4 (four) times daily.         Marland Kitchen topiramate (TOPAMAX) 100 MG tablet Take 100 mg by mouth 2 (two) times daily.         Past Medical History  Diagnosis Date  . GERD (gastroesophageal reflux disease)   . Hypothyroidism   . Toxic thyroid nodule   . Hyperlipidemia   . Migraines    Past Surgical History  Procedure Date  . Total abdominal hysterectomy   . Breast reduction surgery   . Knee arthroscopy     bilateral  . Carpal tunnel release     both hands  . Sinus surgery with instatrak   . Laprscopy    History reviewed. No pertinent family history. Family Status  Relation Status Death Age  . Mother Alive     good health. COPD  . Father Deceased     Triple A, CAD  . Sister Alive     Two have HTN, One has Sarcoidosis, gatroparesis. Two ar in good health  . Brother Alive     CAD  . Child Alive     good health   History   Social History  . Marital Status: Married  Spouse Name: N/A    Number of Children: N/A  . Years of Education: N/A   Occupational History  . Not on file.   Social History Main Topics  . Smoking status: Never Smoker   . Smokeless tobacco: Not on file  . Alcohol Use: No  . Drug Use: No  . Sexually Active: Not on file   Other Topics Concern  . Not on file   Social History Narrative  . No narrative on file   Allergies  Allergen Reactions  . Diclofenac Sodium   . Diphenhydramine Hcl   . Hydrocodone-Acetaminophen   . Ibuprofen   . Naproxen   . Propoxyphene N-Acetaminophen   . Pseudoephedrine   . Relpax (Eletriptan Hydrobromide)   . Voltaren        No current outpatient prescriptions on file prior to visit.    Objective:   Physical Exam Filed Vitals:   06/25/11 1459  BP: 100/70  Pulse: 72  Temp: 97.9 F (36.6 C)  Height: 5\' 3"  (1.6 m)  Weight: 179 lb (81.194 kg)      Alert and oriented. Skin warm and dry. Oral mucosa is moist. Natural teeth in good condition. Sclera anicteric, conjunctivae is pink. Fullness to neck.  No cervical lymphadenopathy. Lungs  clear. Heart regular rate and rhythm.  Abdomen is soft. Bowel sounds are positive. No hepatomegaly. No abdominal masses felt. Tenderness to left upper quadrant.  No edema to lower extremities. Patient is alert and oriented.      Assessment:     PUD needs to be ruled out as well as Barrett's esophagus given her long history of acid reflux.    Plan:     EGD. The risks and benefits such as perforation, bleeding, and infection were reviewed with the patient and is agreeable.

## 2011-06-25 NOTE — Patient Instructions (Signed)
Protonix 30 minutes before breakfast and supper. Gerd diet given to patient. Do not eat after supper.

## 2011-07-10 MED ORDER — SODIUM CHLORIDE 0.45 % IV SOLN
Freq: Once | INTRAVENOUS | Status: AC
  Administered 2011-07-11: 13:00:00 via INTRAVENOUS

## 2011-07-11 ENCOUNTER — Encounter (HOSPITAL_COMMUNITY): Payer: Self-pay | Admitting: *Deleted

## 2011-07-11 ENCOUNTER — Ambulatory Visit (HOSPITAL_COMMUNITY)
Admission: RE | Admit: 2011-07-11 | Discharge: 2011-07-11 | Disposition: A | Discharge status: Home / Self Care | Payer: BC Managed Care – PPO | Source: Ambulatory Visit | Attending: Internal Medicine | Admitting: Internal Medicine

## 2011-07-11 ENCOUNTER — Encounter (HOSPITAL_COMMUNITY)
Admission: RE | Disposition: A | Discharge status: Home / Self Care | Payer: Self-pay | Source: Ambulatory Visit | Attending: Internal Medicine

## 2011-07-11 DIAGNOSIS — D131 Benign neoplasm of stomach: Secondary | ICD-10-CM | POA: Insufficient documentation

## 2011-07-11 DIAGNOSIS — K2970 Gastritis, unspecified, without bleeding: Secondary | ICD-10-CM

## 2011-07-11 DIAGNOSIS — K219 Gastro-esophageal reflux disease without esophagitis: Secondary | ICD-10-CM

## 2011-07-11 DIAGNOSIS — K2990 Gastroduodenitis, unspecified, without bleeding: Secondary | ICD-10-CM

## 2011-07-11 DIAGNOSIS — R1013 Epigastric pain: Secondary | ICD-10-CM | POA: Insufficient documentation

## 2011-07-11 DIAGNOSIS — D1330 Benign neoplasm of unspecified part of small intestine: Secondary | ICD-10-CM

## 2011-07-11 DIAGNOSIS — K2940 Chronic atrophic gastritis without bleeding: Secondary | ICD-10-CM | POA: Insufficient documentation

## 2011-07-11 HISTORY — DX: Other specified postprocedural states: Z98.890

## 2011-07-11 HISTORY — DX: Nausea with vomiting, unspecified: R11.2

## 2011-07-11 SURGERY — ESOPHAGOGASTRODUODENOSCOPY (EGD) WITH ESOPHAGEAL DILATION
Anesthesia: Moderate Sedation

## 2011-07-11 MED ORDER — MIDAZOLAM HCL 5 MG/5ML IJ SOLN
INTRAMUSCULAR | Status: AC
  Filled 2011-07-11: qty 10

## 2011-07-11 MED ORDER — MIDAZOLAM HCL 5 MG/5ML IJ SOLN
INTRAMUSCULAR | Status: DC | PRN
  Administered 2011-07-11 (×4): 2 mg via INTRAVENOUS

## 2011-07-11 MED ORDER — MEPERIDINE HCL 50 MG/ML IJ SOLN
INTRAMUSCULAR | Status: AC
  Filled 2011-07-11: qty 1

## 2011-07-11 MED ORDER — MEPERIDINE HCL 25 MG/ML IJ SOLN
INTRAMUSCULAR | Status: DC | PRN
  Administered 2011-07-11 (×2): 25 mg via INTRAVENOUS

## 2011-07-11 MED ORDER — OMEPRAZOLE-SODIUM BICARBONATE 40-1100 MG PO CAPS: 1.0000 | ORAL_CAPSULE | Freq: Every day | ORAL | Status: DC

## 2011-07-11 MED ORDER — BUTAMBEN-TETRACAINE-BENZOCAINE 2-2-14 % EX AERO
INHALATION_SPRAY | CUTANEOUS | Status: DC | PRN
  Administered 2011-07-11: 2 via TOPICAL

## 2011-07-11 MED ORDER — STERILE WATER FOR IRRIGATION IR SOLN
Status: DC | PRN
  Administered 2011-07-11: 13:00:00

## 2011-07-11 NOTE — Op Note (Signed)
EGD PROCEDURE REPORT  PATIENT:  [name]  MR#:  [id number] Birthdate:  18-Apr-1981, 30 y.o., female Endoscopist:  Dr. Malissa Hippo, MD Referred By:  Dr. Madelin Rear. Fusco M.D. Procedure Date: 07/11/2011  Procedure:   EGD  Indications:  Patient is 30 year old Caucasian female with over 15 her history of heartburn her symptoms are well-controlled with therapy. She also complains of intermittent epigastric pain. He is undergoing diagnostic EGD.            Informed Consent:  Procedure and risks were reviewed the patient and informed consent was obtained. Medications:  Demerol 50 mg IV Versed 8 mg IV Cetacaine spray topically for oropharyngeal anesthesia  Description of procedure:  The endoscope was introduced through the mouth and advanced to the second portion of the duodenum without difficulty or limitations. The mucosal surfaces were surveyed very carefully during advancement of the scope and upon withdrawal.  Findings:  Esophagus:  Mucosa of the esophagus was normal. GE junction was unremarkable. GEJ:  37 cm Stomach:  Stomach was empty and distended very well with insufflation. Folds in the proximal stomach were normal. Examination mucosa reveals 23-4 mm hyperplastic appearing polyps at gastric body along the posterior wall and these were left alone. There was a patchy edema and erythema to mucosa at antrum. No erosions or ulcers were noted. Duodenum:  Normal bulbar and post bulbar mucosa.  Therapeutic/Diagnostic Maneuvers Performed:  None  Complications:  None  Impression: Normal appearing esophageal mucosa without  evidence of erosive esophagitis or Barrett's esophagus. Two small hyperplastic appearing polyps at gastric body which were left alone. Nonerosive antral gastritis.  Recommendations:  Continue anti-reflex measures. Discontinue pantoprazole. Zegrid 40 mg by mouth daily at bedtime. Will check H. pylori serology today and schedule her for hepatobiliary  ultrasound.  Dainelle Hun U  07/11/2011  1:20 PM  CC: Dr. Cassell Smiles., MD & Dr. Bonnetta Barry ref. provider found

## 2011-07-11 NOTE — H&P (Signed)
This is an update to history and physical from 06/25/2011. He is finished up on her antibiotic. Rest of the medications are soft. Her condition has not changed. Is been experiencing heartburn when she was in middle school. The medications that she tried Nexium has worked the best. She is undergoing diagnostic EGD.

## 2011-07-14 ENCOUNTER — Ambulatory Visit (HOSPITAL_COMMUNITY)
Admit: 2011-07-14 | Discharge: 2011-07-14 | Disposition: A | Discharge status: Home / Self Care | Payer: BC Managed Care – PPO | Source: Ambulatory Visit | Attending: Internal Medicine | Admitting: Internal Medicine

## 2011-07-14 DIAGNOSIS — K2990 Gastroduodenitis, unspecified, without bleeding: Secondary | ICD-10-CM | POA: Insufficient documentation

## 2011-07-14 DIAGNOSIS — K2970 Gastritis, unspecified, without bleeding: Secondary | ICD-10-CM | POA: Insufficient documentation

## 2011-07-14 DIAGNOSIS — R109 Unspecified abdominal pain: Secondary | ICD-10-CM | POA: Insufficient documentation

## 2011-07-25 ENCOUNTER — Telehealth (INDEPENDENT_AMBULATORY_CARE_PROVIDER_SITE_OTHER): Payer: Self-pay | Admitting: Internal Medicine

## 2011-07-25 NOTE — Telephone Encounter (Signed)
Would like to know if the authorization has been received from her insurance to cover the Zegerid 40 mg. Please return the call to [phone].

## 2011-07-30 NOTE — Telephone Encounter (Signed)
This has been done. A prescription has been faxed to her mail order pharmacy after being approved for her Zegrid. Mrs. [name] is aware.

## 2011-08-03 ENCOUNTER — Emergency Department (INDEPENDENT_AMBULATORY_CARE_PROVIDER_SITE_OTHER): Payer: BC Managed Care – PPO

## 2011-08-03 ENCOUNTER — Encounter (HOSPITAL_COMMUNITY): Payer: Self-pay

## 2011-08-03 ENCOUNTER — Emergency Department (HOSPITAL_COMMUNITY)
Admission: EM | Admit: 2011-08-03 | Discharge: 2011-08-03 | Disposition: A | Discharge status: Home / Self Care | Payer: BC Managed Care – PPO | Source: Home / Self Care

## 2011-08-03 DIAGNOSIS — J189 Pneumonia, unspecified organism: Secondary | ICD-10-CM

## 2011-08-03 MED ORDER — ACETAMINOPHEN 325 MG PO TABS
650.0000 mg | ORAL_TABLET | Freq: Once | ORAL | Status: AC
  Administered 2011-08-03: 650 mg via ORAL

## 2011-08-03 MED ORDER — ACETAMINOPHEN 325 MG RE SUPP
RECTAL | Status: AC
  Filled 2011-08-03: qty 3

## 2011-08-03 MED ORDER — LEVOFLOXACIN 750 MG PO TABS: 750.0000 mg | ORAL_TABLET | Freq: Every day | ORAL | Status: AC

## 2011-08-03 MED ORDER — ACETAMINOPHEN 325 MG PO TABS
ORAL_TABLET | ORAL | Status: AC
  Filled 2011-08-03: qty 3

## 2011-08-03 NOTE — ED Notes (Signed)
Pt was seen on Nov 6 and told given zpack and told she had pneumonia, no xray done, and continues to cough, fever and pain in ribs bilaterally.

## 2011-08-03 NOTE — ED Provider Notes (Signed)
History     CSN: [id number] Arrival date & time: 08/03/2011 12:10 PM   None     Chief Complaint  Patient presents with  . Cough    (Consider location/radiation/quality/duration/timing/severity/associated sxs/prior treatment) HPI Comments: Pt states she was seen 07-15-11 at another office and was diagnosed with bronchitis, possible early pneumonia. She was started on a ZPak and symptoms had improved with treatment, though cough never completely resolved. In the last approx 1 week cough has worsened again. Is becoming productive, ribs hurt bilaterally with cough. No dyspnea or wheezing. Onset of fever 2 days ago.   Patient is a 30 y.o. female presenting with cough. The history is provided by the patient.  Cough This is a new problem. The current episode started more than 1 week ago. The problem occurs every few minutes. The problem has been gradually worsening. The cough is productive of purulent sputum. The maximum temperature recorded prior to her arrival was 102 to 102.9 F. The fever has been present for 1 to 2 days. Associated symptoms include chills. Pertinent negatives include no chest pain, no ear pain, no rhinorrhea, no sore throat, no myalgias, no shortness of breath and no wheezing. She has tried cough syrup for the symptoms. The treatment provided no relief. Her past medical history is significant for bronchitis. Her past medical history does not include COPD or asthma.    Past Medical History  Diagnosis Date  . GERD (gastroesophageal reflux disease)   . Hypothyroidism   . Hyperlipidemia   . Migraines   . PONV (postoperative nausea and vomiting)   . Asthma     Past Surgical History  Procedure Date  . Total abdominal hysterectomy   . Breast reduction surgery   . Knee arthroscopy     bilateral  . Carpal tunnel release     both hands  . Sinus surgery with instatrak   . Laprscopy     History reviewed. No pertinent family history.  History  Substance Use Topics  .  Smoking status: Never Smoker   . Smokeless tobacco: Not on file  . Alcohol Use: No    OB History    Grav Para Term Preterm Abortions TAB SAB Ect Mult Living                  Review of Systems  Constitutional: Positive for fever and chills.  HENT: Negative for ear pain, congestion, sore throat and rhinorrhea.   Respiratory: Positive for cough. Negative for shortness of breath and wheezing.   Cardiovascular: Negative for chest pain.  Musculoskeletal: Negative for myalgias.    Allergies  Latex; Diclofenac sodium; Diphenhydramine hcl; Doxycycline; Hydrocodone-acetaminophen; Ibuprofen; Naproxen; Propoxyphene n-acetaminophen; Pseudoephedrine; Relpax; and Voltaren  Home Medications   Current Outpatient Rx  Name Route Sig Dispense Refill  . ALBUTEROL SULFATE HFA 108 (90 BASE) MCG/ACT IN AERS Inhalation Inhale 2 puffs into the lungs every 6 (six) hours as needed. Shortness of breath    . CEFUROXIME AXETIL 500 MG PO TABS Oral Take 500 mg by mouth 2 (two) times daily.      Marland Kitchen EPINEPHRINE 0.3 MG/0.3ML IJ DEVI Intramuscular Inject 0.3 mg into the muscle as needed. allergies    . ESTRADIOL 0.1 MG/GM VA CREA Vaginal Place 2 g vaginally once a week.      . ISOMETHEPTENE-APAP-DICHLORAL 65-325-100 MG PO CAPS Oral Take 1 capsule by mouth as needed. headaches    . SIMVASTATIN 10 MG PO TABS Oral Take 10 mg by mouth at bedtime.      Marland Kitchen  SUCRALFATE 1 GM/10ML PO SUSP Oral Take 1 g by mouth 4 (four) times daily.      . TOPIRAMATE 100 MG PO TABS Oral Take 100-200 mg by mouth 2 (two) times daily. Takes 1 tablet in the morning and 2 tablets at bedtime      BP 124/77  Pulse 133  Temp(Src) 102.7 F (39.3 C) (Oral)  Resp 23  SpO2 100%  Physical Exam  Nursing note and vitals reviewed. Constitutional: She appears well-developed and well-nourished. No distress.  HENT:  Head: Normocephalic and atraumatic.  Right Ear: Tympanic membrane, external ear and ear canal normal.  Left Ear: Tympanic membrane,  external ear and ear canal normal.  Nose: Nose normal.  Mouth/Throat: Uvula is midline, oropharynx is clear and moist and mucous membranes are normal. No oropharyngeal exudate, posterior oropharyngeal edema or posterior oropharyngeal erythema.  Neck: Neck supple.  Cardiovascular: Normal rate, regular rhythm and normal heart sounds.   Pulmonary/Chest: Effort normal and breath sounds normal. No respiratory distress. She exhibits no tenderness and no bony tenderness.  Lymphadenopathy:    She has no cervical adenopathy.  Neurological: She is alert.  Skin: Skin is warm and dry.  Psychiatric: She has a normal mood and affect.    ED Course  Procedures (including critical care time)  Labs Reviewed - No data to display Dg Chest 2 View  08/03/2011  *RADIOLOGY REPORT*  Clinical Data: Cough for 1 month.  Fever for 2 days.  CHEST - 2 VIEW  Comparison: None.  Findings: Midline trachea.  Normal heart size and mediastinal contours. No pleural effusion or pneumothorax. Subtle lower lobe predominant interstitial thickening. No lobar consolidation.  IMPRESSION: Mild lower lobe predominant interstitial thickening.  If the patient is a smoker, this could be the sequelae of smoking / chronic bronchitis.  In the acute setting, bronchitis versus viral or mycoplasma pneumonia could look similar.  Original Report Authenticated By: Consuello Bossier, M.D.     No diagnosis found.    MDM  CXR reviewed by radiologist and myself. Results clinically correlated.         Melody Comas, [place] 08/09/11 [phone]

## 2011-08-11 NOTE — ED Provider Notes (Addendum)
Medical screening examination/treatment/procedure(s) were performed by non-physician practitioner and as supervising physician I was immediately available for consultation/collaboration.   Barkley Bruns MD.    Barkley Bruns, MD 08/11/11 2152  Barkley Bruns, MD 08/11/11 2153

## 2011-08-27 ENCOUNTER — Other Ambulatory Visit: Payer: Self-pay

## 2011-08-27 ENCOUNTER — Emergency Department (HOSPITAL_COMMUNITY)
Admission: EM | Admit: 2011-08-27 | Discharge: 2011-08-28 | Disposition: A | Discharge status: Home / Self Care | Payer: BC Managed Care – PPO | Attending: Emergency Medicine | Admitting: Emergency Medicine

## 2011-08-27 ENCOUNTER — Encounter (HOSPITAL_COMMUNITY): Payer: Self-pay | Admitting: *Deleted

## 2011-08-27 ENCOUNTER — Emergency Department (HOSPITAL_COMMUNITY): Payer: BC Managed Care – PPO

## 2011-08-27 DIAGNOSIS — E785 Hyperlipidemia, unspecified: Secondary | ICD-10-CM | POA: Insufficient documentation

## 2011-08-27 DIAGNOSIS — R059 Cough, unspecified: Secondary | ICD-10-CM | POA: Insufficient documentation

## 2011-08-27 DIAGNOSIS — R079 Chest pain, unspecified: Secondary | ICD-10-CM | POA: Insufficient documentation

## 2011-08-27 DIAGNOSIS — R05 Cough: Secondary | ICD-10-CM | POA: Insufficient documentation

## 2011-08-27 MED ORDER — OXYCODONE-ACETAMINOPHEN 5-325 MG PO TABS
1.0000 | ORAL_TABLET | Freq: Once | ORAL | Status: AC
  Administered 2011-08-27: 1 via ORAL
  Filled 2011-08-27: qty 1

## 2011-08-27 NOTE — ED Notes (Signed)
Patient transported to X-ray 

## 2011-08-27 NOTE — ED Notes (Signed)
Pain in right rib cage, felt something pop in right lateral rib cage

## 2011-08-27 NOTE — ED Provider Notes (Signed)
Scribed for Joya Gaskins, MD, the patient was seen in room APA12/APA12 . This chart was scribed by Ellie Lunch.   CSN: [id number] Arrival date & time: 08/27/2011 10:34 PM   First MD Initiated Contact with Patient 08/27/11 2321      Chief Complaint  Patient presents with  . Chest Pain    Patient is a 30 y.o. female presenting with chest pain. The history is provided by the patient. No language interpreter was used.  Chest Pain The chest pain began more than 2 weeks ago. Chest pain occurs frequently. The chest pain is worsening. The pain is associated with coughing. At its most intense, the pain is at 9/10. The pain is currently at 9/10. The quality of the pain is described as sharp. Exacerbated by: movement, palpation, and coughing. Primary symptoms include cough.   Pt c/o of 3 weeks of right rib pain with associated cough. Pt was dx with pneumonia in both lungs 11/25 by urgent care. Pt was treated with abx and steroids. Pt reports cough and pain were improving until tonight when it became worse. Pt  bent over tonight, coughed, and heard a pop. Now right rib pain is a stabbing sensation. Pain is worse with movement, palpation, and cough. Pt denies fall or injury.   Past Medical History  Diagnosis Date  . GERD (gastroesophageal reflux disease)   . Hypothyroidism   . Hyperlipidemia   . Migraines   . PONV (postoperative nausea and vomiting)   . Asthma     Past Surgical History  Procedure Date  . Total abdominal hysterectomy   . Breast reduction surgery   . Knee arthroscopy     bilateral  . Carpal tunnel release     both hands  . Sinus surgery with instatrak   . Laprscopy     No family history on file.  History  Substance Use Topics  . Smoking status: Never Smoker   . Smokeless tobacco: Not on file  . Alcohol Use: No    Review of Systems  Respiratory: Positive for cough.   Cardiovascular: Positive for chest pain (chest wall tenderness).  All other systems  reviewed and are negative.   Allergies  Latex; Codeine; Diclofenac sodium; Diphenhydramine hcl; Doxycycline; Hydrocodone-acetaminophen; Ibuprofen; Naproxen; Propoxyphene n-acetaminophen; Pseudoephedrine; Relpax; and Voltaren  Home Medications   Current Outpatient Rx  Name Route Sig Dispense Refill  . ALBUTEROL SULFATE HFA 108 (90 BASE) MCG/ACT IN AERS Inhalation Inhale 2 puffs into the lungs every 6 (six) hours as needed. Shortness of breath    . ALBUTEROL SULFATE 4 MG PO TABS Oral Take 4 mg by mouth 3 (three) times daily.      Marland Kitchen ESTRADIOL 0.1 MG/GM VA CREA Vaginal Place 2 g vaginally once a week.      . ISOMETHEPTENE-APAP-DICHLORAL 65-325-100 MG PO CAPS Oral Take 1 capsule by mouth as needed. headaches    . LORATADINE 10 MG PO TABS Oral Take 10 mg by mouth daily.      Marland Kitchen OMEPRAZOLE-SODIUM BICARBONATE 40-1100 MG PO CAPS Oral Take 1 capsule by mouth daily before breakfast.      . SIMVASTATIN 10 MG PO TABS Oral Take 10 mg by mouth at bedtime.      . SUCRALFATE 1 GM/10ML PO SUSP Oral Take 1 g by mouth 2 (two) times daily.     . TOPIRAMATE 100 MG PO TABS Oral Take 100-200 mg by mouth 2 (two) times daily. Takes 1 tablet in the morning and 2 tablets  at bedtime    . EPINEPHRINE 0.3 MG/0.3ML IJ DEVI Intramuscular Inject 0.3 mg into the muscle as needed. allergies      BP 138/81  Pulse 83  Temp(Src) 98.4 F (36.9 C) (Oral)  Resp 20  Ht 5\' 3"  (1.6 m)  Wt 166 lb (75.297 kg)  BMI 29.41 kg/m2  SpO2 100%  Physical Exam CONSTITUTIONAL: Well developed/well nourished HEAD AND FACE: Normocephalic/atraumatic EYES: EOMI/PERRL ENMT: Mucous membranes moist NECK: supple no meningeal signs SPINE:entire spine nontender CV: S1/S2 noted, no murmurs/rubs/gallops noted LUNGS: Lungs are clear to auscultation bilaterally, no apparent distress. Point tenderness right ribs with no crepitance/bruising.  ABDOMEN: soft, nontender, no rebound or guarding GU:no cva tenderness NEURO: Pt is awake/alert, moves all  extremitiesx4 EXTREMITIES: pulses normal, full ROM SKIN: warm, color normal PSYCH: no abnormalities of mood noted  ED Course  Procedures (including critical care time) DIAGNOSTIC STUDIES: Oxygen Saturation is 100% on room air, normal by my interpretation.    COORDINATION OF CARE:    MDM  Nursing notes reviewed and considered in documentation xrays reviewed and considered    Date: 08/28/2011  Rate: 69  Rhythm: normal sinus rhythm  QRS Axis: normal  Intervals: normal  ST/T Wave abnormalities: normal  Conduction Disutrbances:none  Narrative Interpretation:   Old EKG Reviewed: unchanged    I personally performed the services described in this documentation, which was scribed in my presence. The recorded information has been reviewed and considered.          Joya Gaskins, MD 08/28/11 Emeline Darling

## 2011-08-28 MED ORDER — OXYCODONE-ACETAMINOPHEN 5-325 MG PO TABS: 1.0000 | ORAL_TABLET | Freq: Four times a day (QID) | ORAL | Status: AC | PRN

## 2011-08-28 NOTE — ED Notes (Signed)
Pt DC to home in the care of family members.

## 2011-09-15 ENCOUNTER — Encounter (INDEPENDENT_AMBULATORY_CARE_PROVIDER_SITE_OTHER): Payer: Self-pay | Admitting: Internal Medicine

## 2011-09-15 ENCOUNTER — Ambulatory Visit (INDEPENDENT_AMBULATORY_CARE_PROVIDER_SITE_OTHER): Payer: BC Managed Care – PPO | Admitting: Internal Medicine

## 2011-09-15 VITALS — BP 118/78 | HR 88 | Temp 97.8°F | Resp 14 | Ht 63.0 in | Wt 181.0 lb

## 2011-09-15 DIAGNOSIS — E785 Hyperlipidemia, unspecified: Secondary | ICD-10-CM

## 2011-09-15 DIAGNOSIS — J189 Pneumonia, unspecified organism: Secondary | ICD-10-CM

## 2011-09-15 DIAGNOSIS — K219 Gastro-esophageal reflux disease without esophagitis: Secondary | ICD-10-CM

## 2011-09-15 NOTE — Patient Instructions (Signed)
Continue anti-reflux measures and try to lose 15-20 pounds. Call if Zegrid stops working or you experience side effects.

## 2011-09-15 NOTE — Progress Notes (Signed)
Presenting complaint; Followup for chronic GERD. Subjective: Patient is 31 year old Caucasian female was had symptoms of GERD for more than 15 years not responding to pantoprazole and Carafate. She underwent EGD on 07-2011. She had no evidence of erosive esophagitis or other complications she had 2 small hyperplastic appearing polyps in her stomach and nonerosive gastritis. Her H. pylori serology was negative. She was switched to Zegerid and underwent hepatobiliary ultrasound which was negative for cholelithiasis or dilated bile duct. She is feeling a lot better since she has been on Zegerid. She denies regurgitation or dysphagia. She may have fleeting heartburn once or twice a week. She is still having some cough but she believes it is secondary to pneumonia which was diagnosed on 08/03/2011. She is still having intermittent wheezing and using bronchodilators. She was also given a short course of prednisone. Her weight is only up by 2 pounds since her last visit. Current Medications: Current Outpatient Prescriptions  Medication Sig Dispense Refill  . albuterol (PROVENTIL HFA;VENTOLIN HFA) 108 (90 BASE) MCG/ACT inhaler Inhale 2 puffs into the lungs every 6 (six) hours as needed. Shortness of breath      . albuterol (PROVENTIL) 4 MG tablet Take 4 mg by mouth 3 (three) times daily.        Marland Kitchen EPINEPHrine (EPI-PEN) 0.3 mg/0.3 mL DEVI Inject 0.3 mg into the muscle as needed. allergies      . estradiol (ESTRACE) 0.1 MG/GM vaginal cream Place 2 g vaginally once a week.        . isometheptene-acetaminophen-dichloralphenazone (MIDRIN) 65-325-100 MG capsule Take 1 capsule by mouth as needed. headaches      . loratadine (CLARITIN) 10 MG tablet Take 10 mg by mouth daily. As Needed      . omeprazole-sodium bicarbonate (ZEGERID) 40-1100 MG per capsule Take 1 capsule by mouth daily before breakfast.        . simvastatin (ZOCOR) 10 MG tablet Take 10 mg by mouth at bedtime.        . sucralfate (CARAFATE) 1 GM/10ML  suspension Take 1 g by mouth 2 (two) times daily.       Marland Kitchen topiramate (TOPAMAX) 100 MG tablet Take 100-200 mg by mouth 2 (two) times daily. Takes 1 tablet in the morning and 2 tablets at bedtime        Objective: BP 118/78  Pulse 88  Temp(Src) 97.8 F (36.6 C) (Oral)  Resp 14  Ht 5\' 3"  (1.6 m)  Wt 181 lb (82.101 kg)  BMI 32.06 kg/m2  Conjunctiva is pink. Sclera is nonicteric Oral pharyngeal mucosa is normal. Tonsils prominent but no exudate noted No neck masses or thyromegaly noted. Cardiac exam with regular rhythm normal S1 and S2. No murmur or gallop noted. Lungs are clear to auscultation. Abdomen is soft and nontender without organomegaly or masses. No LE edema or clubbing noted.  Assessment:  Chronic GERD. No evidence of complications of disease based on recent EGD. She is responding quite well to Zegerid. She still must work on losing weight and maybe down the road dose could be reduced. Ultrasound negative for cholelithiasis. Recently diagnosed with pneumonia doubt that it was secondary to aspiration.   Plan: Continue anti-reflux measures. Discontinue sucralfate and prescription runs out. Continue Zegerid at 40 mg by mouth every morning. Office visit in November 2013

## 2011-10-21 ENCOUNTER — Telehealth (INDEPENDENT_AMBULATORY_CARE_PROVIDER_SITE_OTHER): Payer: Self-pay | Admitting: *Deleted

## 2011-10-21 NOTE — Telephone Encounter (Signed)
Admire's medications are going to cost her $476.00. She now has a deductible. Would like for Tammy to please return her call at [phone].

## 2011-10-22 NOTE — Telephone Encounter (Signed)
I have called the patient's insurance company, on the phone with 5 different people and 1 hour. They state that the Omeprazole/sodium Bicarbonate is a preferred as is Pantoprazole, and Nexium. This is not the problem, the patient's insurance changed since the PA was done in November. They will have to meet deductions prior to them picking up and the patient not having to pay anything. The patient was called and made aware.

## 2011-10-23 ENCOUNTER — Encounter (HOSPITAL_COMMUNITY): Payer: Self-pay | Admitting: Dietician

## 2011-10-23 NOTE — Progress Notes (Signed)
[facility] Diabetes Class Completion  Date:October 23, 2011  Time: 6:30 PM  Pt attended [facility]'s Diabetes Class on October 23, 2011.   Patient was educated on the following topics: carbohydrate metabolism in relation to diabetes, sources of carbohydrate, carbohydrate counting, meal planning strategies, food label reading, and portion control.   Garris Melhorn A. Kayan, RD, LDN Date:October 23, 2011 Time: 6:30 PM 

## 2011-11-07 ENCOUNTER — Telehealth (INDEPENDENT_AMBULATORY_CARE_PROVIDER_SITE_OTHER): Payer: Self-pay | Admitting: *Deleted

## 2011-11-07 NOTE — Telephone Encounter (Signed)
She can take Zegrid  OTC twice daily

## 2011-11-07 NOTE — Telephone Encounter (Signed)
Dr. Karilyn Cota - Patient called Friday and wants to make sure taking Zergerid OTC 20 mg twice a day long term will not hurt her. The prescription will cost to much due to their Insurance change effective 09-2011. Purchasing it this way is much cheaper.

## 2011-12-04 ENCOUNTER — Encounter (INDEPENDENT_AMBULATORY_CARE_PROVIDER_SITE_OTHER): Payer: Self-pay

## 2012-03-24 ENCOUNTER — Other Ambulatory Visit (HOSPITAL_COMMUNITY): Payer: Self-pay | Admitting: Internal Medicine

## 2012-03-24 DIAGNOSIS — R110 Nausea: Secondary | ICD-10-CM

## 2012-03-24 DIAGNOSIS — R109 Unspecified abdominal pain: Secondary | ICD-10-CM

## 2012-03-26 ENCOUNTER — Encounter (HOSPITAL_COMMUNITY): Payer: Self-pay

## 2012-03-26 ENCOUNTER — Encounter (HOSPITAL_COMMUNITY)
Admission: RE | Admit: 2012-03-26 | Discharge: 2012-03-26 | Disposition: A | Discharge status: Home / Self Care | Payer: BC Managed Care – PPO | Source: Ambulatory Visit | Attending: Internal Medicine | Admitting: Internal Medicine

## 2012-03-26 DIAGNOSIS — R109 Unspecified abdominal pain: Secondary | ICD-10-CM

## 2012-03-26 DIAGNOSIS — R110 Nausea: Secondary | ICD-10-CM

## 2012-03-26 MED ORDER — AMPHOTERICIN B 50 MG IJ SOLR
INTRAMUSCULAR | Status: AC
  Filled 2012-03-26: qty 100

## 2012-03-26 MED ORDER — TECHNETIUM TC 99M MEBROFENIN IV KIT
5.0000 | PACK | Freq: Once | INTRAVENOUS | Status: AC | PRN
  Administered 2012-03-26: 5.1 via INTRAVENOUS

## 2012-03-26 MED ORDER — AMPICILLIN SODIUM 125 MG IJ SOLR
INTRAMUSCULAR | Status: AC
  Filled 2012-03-26: qty 250

## 2012-06-16 ENCOUNTER — Encounter (INDEPENDENT_AMBULATORY_CARE_PROVIDER_SITE_OTHER): Payer: Self-pay | Admitting: *Deleted

## 2012-07-13 ENCOUNTER — Ambulatory Visit (INDEPENDENT_AMBULATORY_CARE_PROVIDER_SITE_OTHER): Payer: BC Managed Care – PPO | Admitting: Internal Medicine

## 2012-08-24 ENCOUNTER — Ambulatory Visit (INDEPENDENT_AMBULATORY_CARE_PROVIDER_SITE_OTHER): Payer: BC Managed Care – PPO | Admitting: Internal Medicine

## 2012-08-24 ENCOUNTER — Encounter (INDEPENDENT_AMBULATORY_CARE_PROVIDER_SITE_OTHER): Payer: Self-pay | Admitting: Internal Medicine

## 2012-08-24 VITALS — BP 110/70 | HR 72 | Temp 98.4°F | Ht 63.0 in | Wt 188.1 lb

## 2012-08-24 DIAGNOSIS — R1011 Right upper quadrant pain: Secondary | ICD-10-CM | POA: Insufficient documentation

## 2012-08-24 DIAGNOSIS — R52 Pain, unspecified: Secondary | ICD-10-CM

## 2012-08-24 LAB — COMPREHENSIVE METABOLIC PANEL
Albumin: 4.5 g/dL (ref 3.5–5.2)
Alkaline Phosphatase: 85 U/L (ref 39–117)
BUN: 15 mg/dL (ref 6–23)
Calcium: 9.6 mg/dL (ref 8.4–10.5)
Chloride: 103 mEq/L (ref 96–112)
Creat: 1.06 mg/dL (ref 0.50–1.10)
Glucose, Bld: 82 mg/dL (ref 70–99)
Potassium: 4.3 mEq/L (ref 3.5–5.3)

## 2012-08-24 NOTE — Patient Instructions (Addendum)
[place] and labs. May refer to surgeon to discuss.

## 2012-08-24 NOTE — Progress Notes (Signed)
Subjective:     Patient ID: [name], female   DOB: [birth year], 31 y.o.   MRN: [id number]  HPI Presents today with c/o rt upper quadrant pain. Symptoms since July. She tells me the pain has been constant.  It was off and on. She tells me she has frequent nausea. Pain occurs 30 minutes after eating. Appetite has been good. No weight loss. She has actually gained weight.  BMs are loose. No melena or bright red rectal bleeding. Stools are orange in color.  US abdomen 07/2011: normal 07/11/2011 EGD: Complications: None  Impression:  Normal appearing esophageal mucosa without evidence of erosive esophagitis or Barrett's esophagus.  Two small hyperplastic appearing polyps at gastric body which were left alone.  Nonerosive antral gastritis. 03/2012 HIDA scan: IMPRESSION: EF 88% 1. No scintigraphic evidence of acute cholecystitis or biliary  dyskinesia.  2. The patient experienced mild abdominal pain throughout the  procedure, not exacerbated during the consumption of the fatty  Meal. .cme  Review of Systems see hpi Current Outpatient Prescriptions  Medication Sig Dispense Refill  . albuterol (PROVENTIL HFA;VENTOLIN HFA) 108 (90 BASE) MCG/ACT inhaler Inhale 2 puffs into the lungs every 6 (six) hours as needed. Shortness of breath      . EPINEPHrine (EPI-PEN) 0.3 mg/0.3 mL DEVI Inject 0.3 mg into the muscle as needed. allergies      . estradiol (ESTRACE) 0.1 MG/GM vaginal cream Place 2 g vaginally once a week.        . loratadine (CLARITIN) 10 MG tablet Take 10 mg by mouth daily. As Needed      . omeprazole-sodium bicarbonate (ZEGERID) 40-1100 MG per capsule Take 1 capsule by mouth daily before breakfast.        . simvastatin (ZOCOR) 10 MG tablet Take 10 mg by mouth at bedtime.        . topiramate (TOPAMAX) 100 MG tablet Take 100-200 mg by mouth 2 (two) times daily. Takes 1 tablet in the morning and 2 tablets at bedtime      . omeprazole-sodium bicarbonate (ZEGERID) 40-1100 MG per capsule  Take 1 capsule by mouth at bedtime.  30 capsule  5   Past Medical History  Diagnosis Date  . GERD (gastroesophageal reflux disease)   . Hypothyroidism   . Hyperlipidemia   . Migraines   . PONV (postoperative nausea and vomiting)   . Asthma   . Pneumonia    Allergies  Allergen Reactions  . Latex     Breaking out and will affect breathing  . Codeine Nausea And Vomiting  . Diclofenac Sodium   . Diclofenac Sodium   . Diphenhydramine Hcl   . Doxycycline     Increases blood pressure and respiratory rate Chest pain  . Hydrocodone-Acetaminophen   . Ibuprofen   . Naproxen   . Propoxyphene-Acetaminophen   . Pseudoephedrine   . Relpax (Eletriptan Hydrobromide)          Objective:   Physical Exam Filed Vitals:   08/24/12 1159  BP: 110/70  Pulse: 72  Temp: 98.4 F (36.9 C)  Height: 5\' 3"  (1.6 m)  Weight: 188 lb 1.6 oz (85.322 kg)  Alert and oriented. Skin warm and dry. Oral mucosa is moist.   . Sclera anicteric, conjunctivae is pink. Thyroid not enlarged. No cervical lymphadenopathy. Lungs clear. Heart regular rate and rhythm.  Abdomen is soft. Bowel sounds are positive. No hepatomegaly. No abdominal masses felt. Rt upper quadrant tenderness.  No edema to lower extremities.  Assessment:    Rt upper quadrant pain with normal HIDA scan.  ? GB disease    Plan:    US abdomen. Cmet. Further recommendations. May refer to surgeon to discuss.,

## 2012-08-25 ENCOUNTER — Ambulatory Visit (HOSPITAL_COMMUNITY)
Admission: RE | Admit: 2012-08-25 | Discharge: 2012-08-25 | Disposition: A | Discharge status: Home / Self Care | Payer: BC Managed Care – PPO | Source: Ambulatory Visit | Attending: Internal Medicine | Admitting: Internal Medicine

## 2012-08-25 DIAGNOSIS — R109 Unspecified abdominal pain: Secondary | ICD-10-CM | POA: Insufficient documentation

## 2012-08-25 DIAGNOSIS — R1011 Right upper quadrant pain: Secondary | ICD-10-CM

## 2012-09-20 ENCOUNTER — Encounter (INDEPENDENT_AMBULATORY_CARE_PROVIDER_SITE_OTHER): Payer: Self-pay | Admitting: Internal Medicine

## 2012-09-20 ENCOUNTER — Ambulatory Visit (INDEPENDENT_AMBULATORY_CARE_PROVIDER_SITE_OTHER): Payer: BC Managed Care – PPO | Admitting: Internal Medicine

## 2012-09-20 VITALS — BP 108/70 | HR 80 | Temp 98.8°F | Resp 18 | Ht 63.0 in | Wt 186.4 lb

## 2012-09-20 DIAGNOSIS — K219 Gastro-esophageal reflux disease without esophagitis: Secondary | ICD-10-CM

## 2012-09-20 DIAGNOSIS — R1011 Right upper quadrant pain: Secondary | ICD-10-CM

## 2012-09-20 MED ORDER — OMEPRAZOLE-SODIUM BICARBONATE 20-1100 MG PO CAPS: 1.0000 | ORAL_CAPSULE | Freq: Every day | ORAL | Status: DC

## 2012-09-20 MED ORDER — OMEPRAZOLE-SODIUM BICARBONATE 20-1680 MG PO PACK: 12.0000 mg | PACK | Freq: Every day | ORAL | Status: DC

## 2012-09-20 NOTE — Progress Notes (Signed)
Presenting complaint;  Followup for right upper quadrant abdominal pain.  Subjective:  Patient is 32 year old Caucasian female who is here for scheduled visit. She's been experiencing right upper quadrant pain intermittently for several years but this time she's been having intermittent pain since July 2013. She a negative ultrasound and November 2012. She had HIDA scan in July 2013 with EF of 58.4% and her symptoms are not reproduced. She had normal LFTs in December 2013 and finally she had another ultrasound about 3 weeks ago and was negative for cholelithiasis dilated bile duct or wall thickening. She feels better. She hasn't had a bad episode in the last few weeks. She did have mild pain with a week and when she was eating coleslaw but it subsided spontaneously. When she has pain that radiates posteriorly and is associated with nausea. She describes this pain to be stabbing pain it always occurs after meals. She is on a bland diet and also is limited due to and as recommended by her primary care physician. Patient states her heartburn is well controlled with Zegerid OTC; she could not afford the prescription medication.  Current Medications: Current Outpatient Prescriptions  Medication Sig Dispense Refill  . albuterol (PROVENTIL HFA;VENTOLIN HFA) 108 (90 BASE) MCG/ACT inhaler Inhale 2 puffs into the lungs every 6 (six) hours as needed. Shortness of breath      . EPINEPHrine (EPI-PEN) 0.3 mg/0.3 mL DEVI Inject 0.3 mg into the muscle as needed. allergies      . estradiol (ESTRACE) 0.1 MG/GM vaginal cream Place 2 g vaginally once a week.        . loratadine (CLARITIN) 10 MG tablet Take 10 mg by mouth daily. As Needed      . omeprazole-sodium bicarbonate (ZEGERID) 40-1100 MG per capsule Take 1 capsule by mouth daily before breakfast. Patient states that she is currently taking 20 mg OTC      . simvastatin (ZOCOR) 10 MG tablet Take 10 mg by mouth at bedtime.        . topiramate (TOPAMAX) 100 MG  tablet Take 200 mg by mouth at bedtime.       Marland Kitchen omeprazole-sodium bicarbonate (ZEGERID) 40-1100 MG per capsule Take 1 capsule by mouth at bedtime.  30 capsule  5     Objective: Blood pressure 108/70, pulse 80, temperature 98.8 F (37.1 C), temperature source Oral, resp. rate 18, height 5\' 3"  (1.6 m), weight 186 lb 6.4 oz (84.55 kg). Patient is alert and in no acute distress. Conjunctiva is pink. Sclera is nonicteric Oropharyngeal mucosa is normal. No neck masses or thyromegaly noted. Abdomen is symmetrical and soft. Mild tenderness noted below right costal margin but no hepatomegaly or other abnormalities.  No LE edema or clubbing noted.  Labs/studies Results: Comprehensive chemistry panel from 08/24/2012 within normal. Ultrasound from 08/26/2012 No gallstones or gallbladder wall thickening. CBD measured 5 mm and no abnormality noted to hepatic parenchyma no abnormality noted to both kidneys.  Assessment:  #1. Recurrent right upper quadrant pain with features suggestive of biliary tract disease but workup is negative. Workup includes normal LFTs, height is scan with EF of 88% in July 2013 and recent negative hepatobiliary ultrasound. She has been on low-calorie gluten-free diet and has had mild episodes. Therefore will monitor her for the time being. #2. GERD. Symptoms well controlled with Zegerid OTC. Last EGD was in November 2012 revealing non-erosive gastritis and H. pylori serology was negative.   Plan:  Patient will continue current therapy for GERD which is Zegerid  OTC 1 daily. She will continue low-calorie gluten-free diet as recommended by primary care physician. Patient will keep symptom diary and call us with progress report in 12 weeks. If she has another episode of severe right upper quadrant pain will seek surgical consultation for possible cholecystectomy.

## 2012-09-20 NOTE — Patient Instructions (Signed)
Keep symptom diary and call us with progress report in 12 weeks. Feel free to call earlier if you have another episode of severe pain.

## 2012-10-23 ENCOUNTER — Other Ambulatory Visit: Payer: Self-pay

## 2012-11-14 IMAGING — US US SOFT TISSUE HEAD/NECK
1 series · 14 of 25 positions shown · non-contrast
Comparison: 05/02/2005

CLINICAL DATA: Enlarged thyroid gland

THYROID ULTRASOUND
TECHNIQUE: Ultrasound examination of the thyroid gland and adjacent
soft tissues was performed.

[Series 1: us soft tissue head/neck · 0.08mm/px · 14 of 42 slices shown]
[im 1/42]
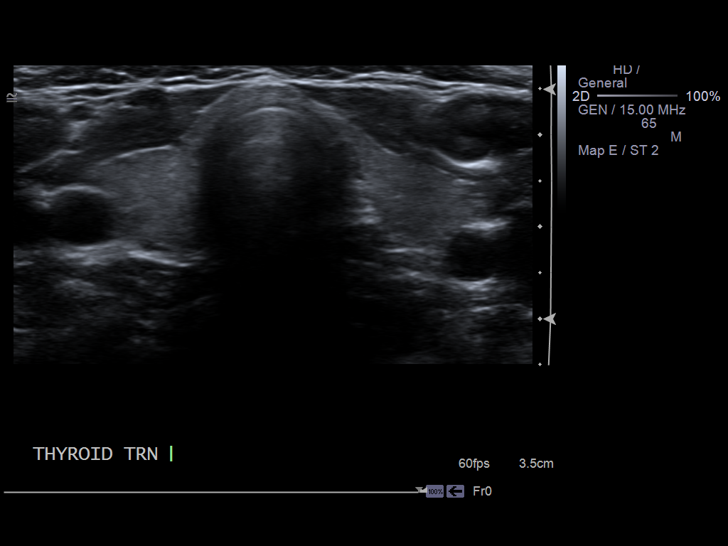
[im 4/42]
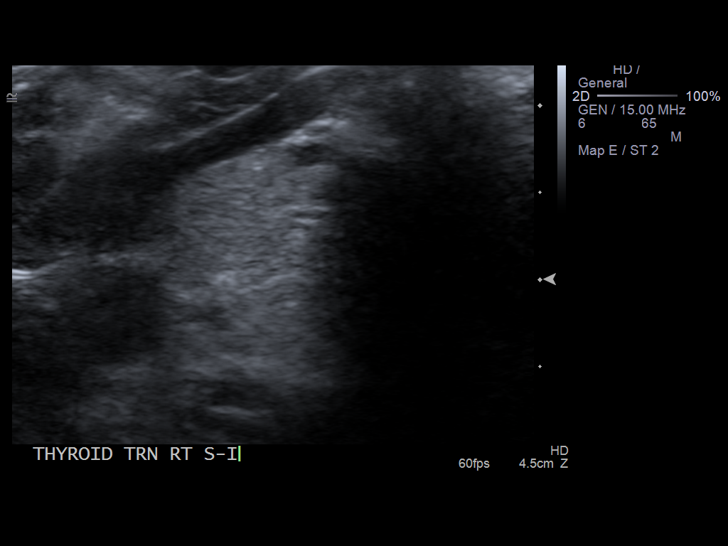
[im 7/42]
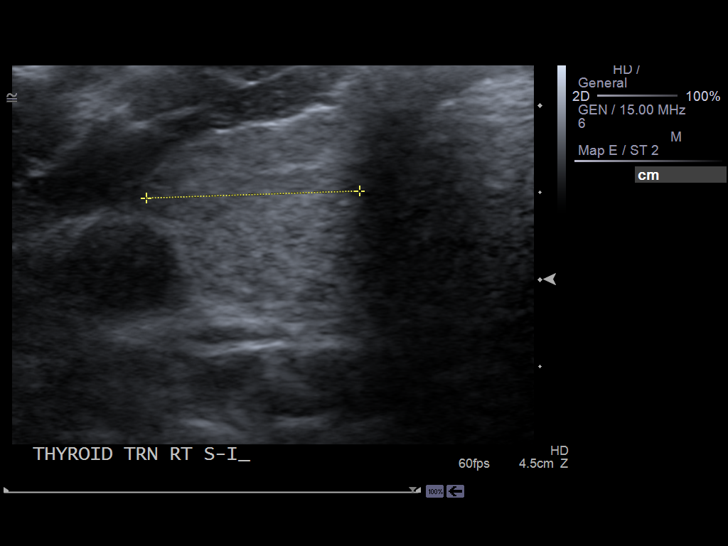
[im 11/42]
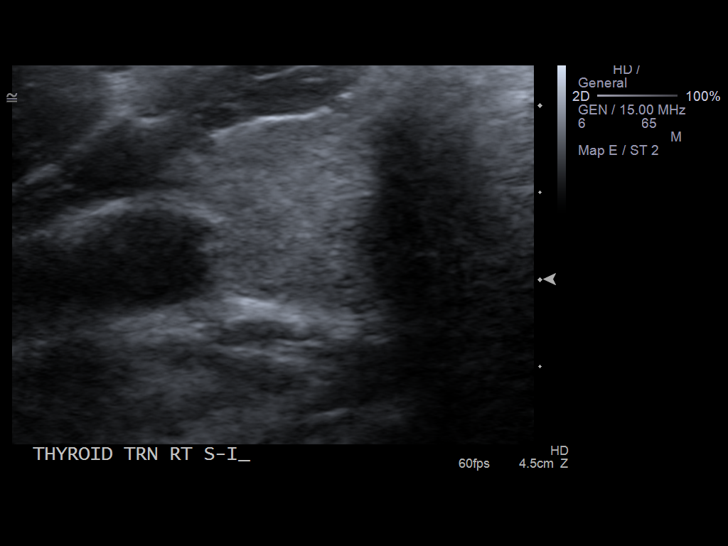
[im 14/42]
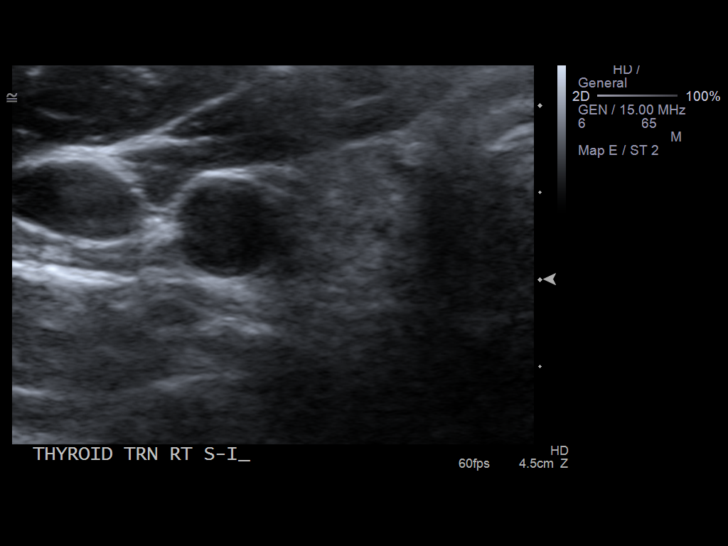
[im 16/42]
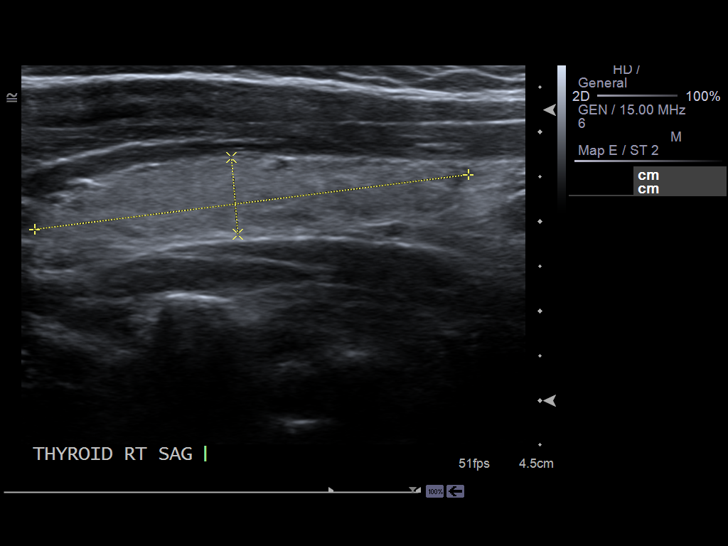
[im 19/42]
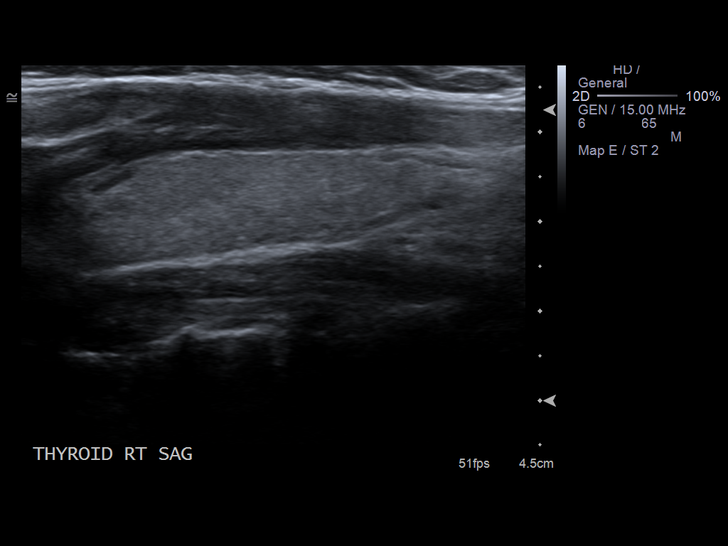
[im 23/42]
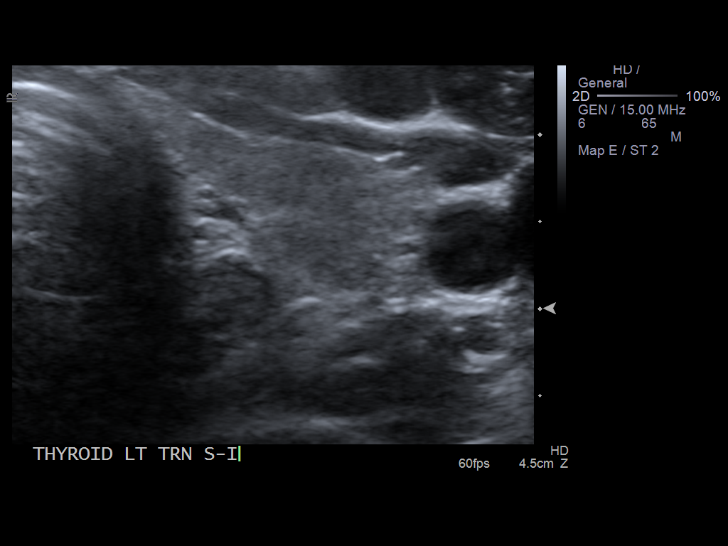
[im 26/42]
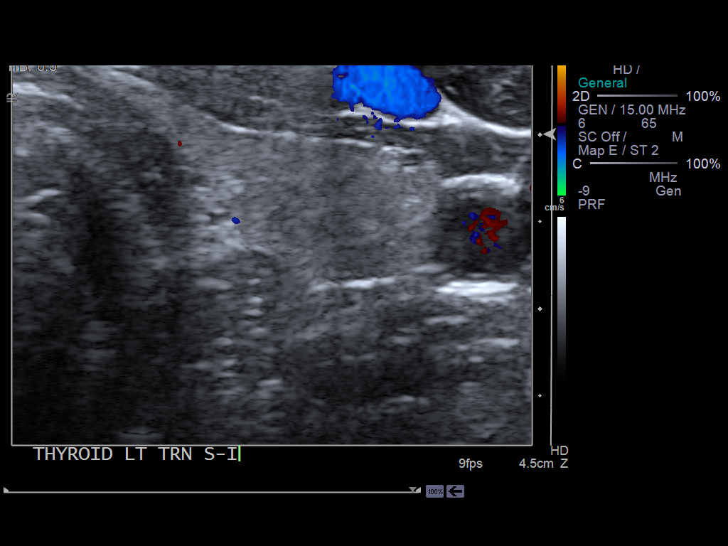
[im 28/42]
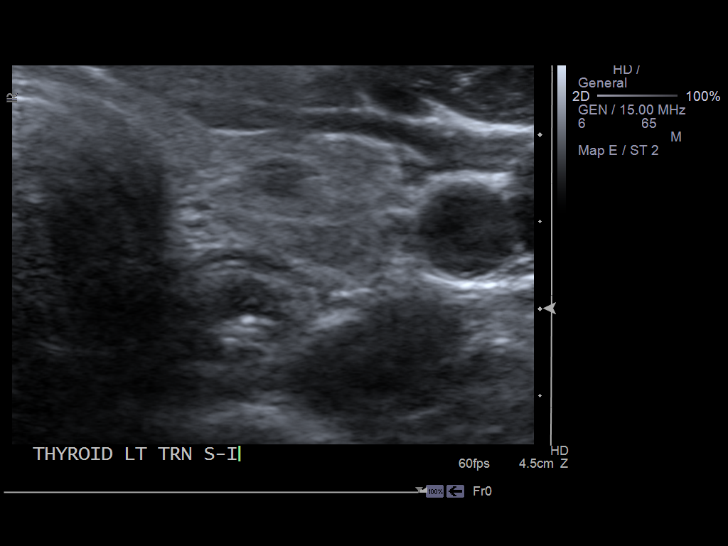
[im 31/42]
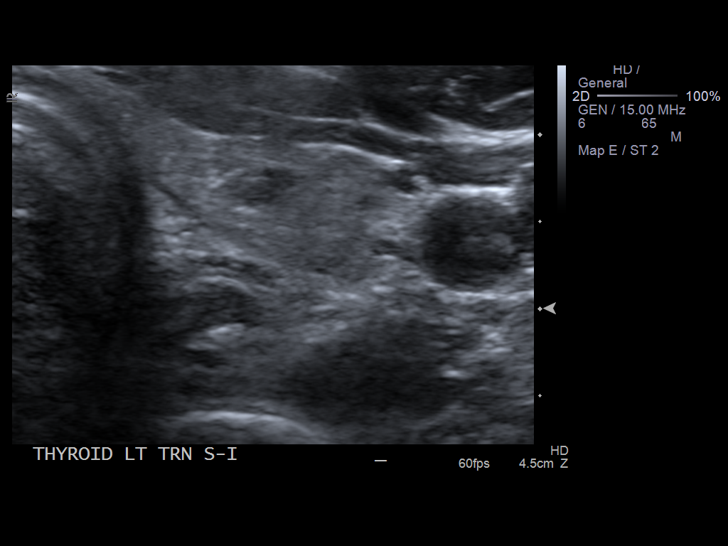
[im 35/42]
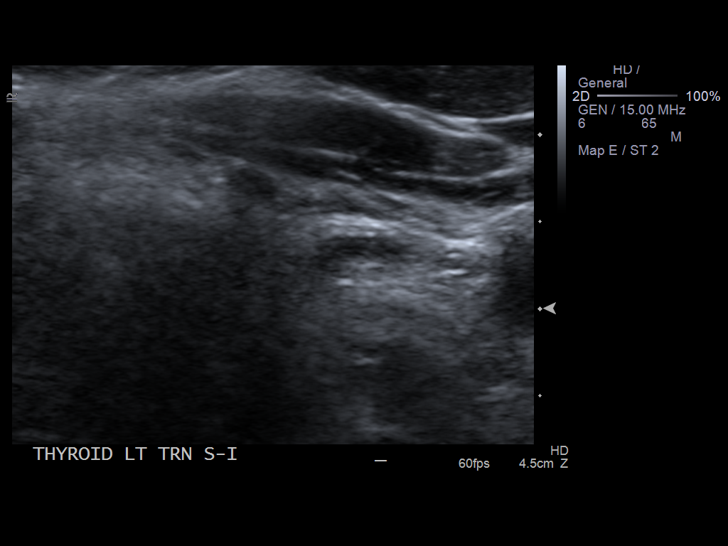
[im 38/42]
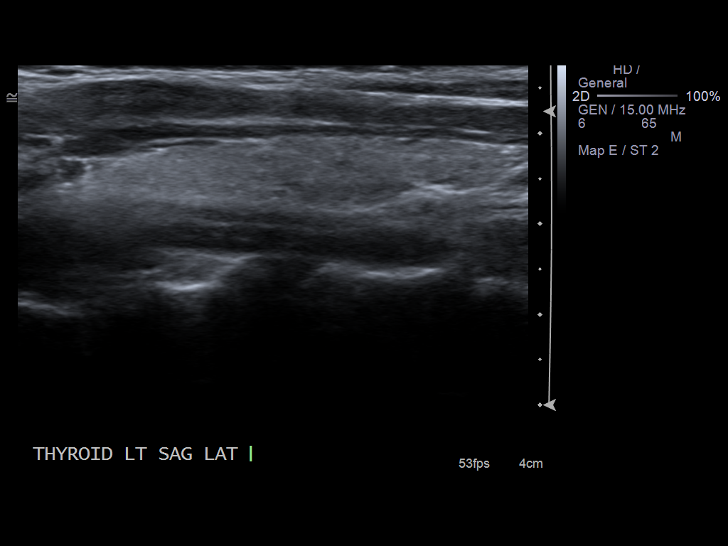
[im 42/42]
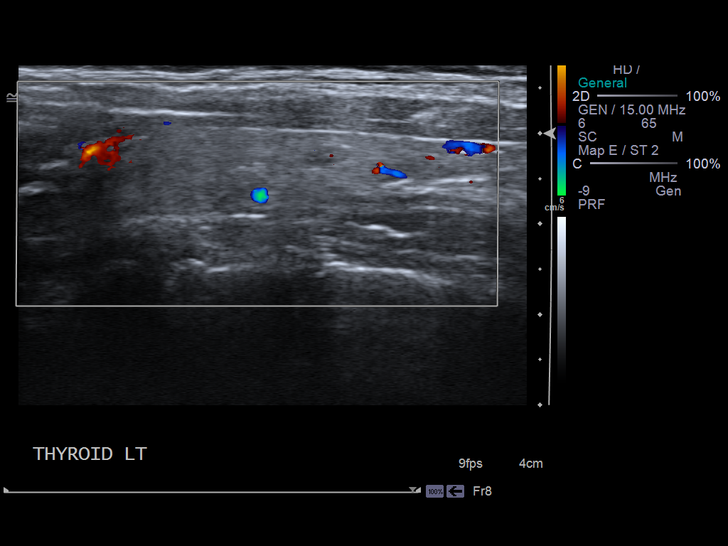

[14 of 25 positions shown; findings below may reference images not displayed]

FINDINGS: Right thyroid lobe:  4.9 x 0.9 x 1.2 cm
Left thyroid lobe:  4.9 x 0.9 x 1.5 cm
Isthmus:  2 mm thick

Focal nodules:  Fairly homogeneous parenchymal echogenicity
throughout both thyroid lobes.  Small hypoechoic nodule left lobe 3
x 5 x 2 mm, previously 6 x 5 x 2 mm.  No new thyroid mass or
nodule.  No thyroid calcification or cyst.

Lymphadenopathy:  None visualized.
IMPRESSION: Tiny nonspecific left thyroid nodule, slightly decreased in size
since previous study.
Otherwise normal exam.

## 2013-07-14 ENCOUNTER — Other Ambulatory Visit: Payer: Self-pay

## 2013-12-15 ENCOUNTER — Other Ambulatory Visit (HOSPITAL_COMMUNITY): Payer: Self-pay | Admitting: "Endocrinology

## 2013-12-15 DIAGNOSIS — E049 Nontoxic goiter, unspecified: Secondary | ICD-10-CM

## 2013-12-22 ENCOUNTER — Ambulatory Visit (HOSPITAL_COMMUNITY)
Admission: RE | Admit: 2013-12-22 | Discharge: 2013-12-22 | Disposition: A | Discharge status: Home / Self Care | Payer: BC Managed Care – PPO | Source: Ambulatory Visit | Attending: "Endocrinology | Admitting: "Endocrinology

## 2013-12-22 ENCOUNTER — Other Ambulatory Visit (HOSPITAL_COMMUNITY): Payer: BC Managed Care – PPO

## 2013-12-22 DIAGNOSIS — E049 Nontoxic goiter, unspecified: Secondary | ICD-10-CM | POA: Insufficient documentation

## 2014-10-13 ENCOUNTER — Other Ambulatory Visit (HOSPITAL_COMMUNITY): Payer: Self-pay | Admitting: Physician Assistant

## 2014-10-13 DIAGNOSIS — N63 Unspecified lump in unspecified breast: Secondary | ICD-10-CM

## 2014-10-24 ENCOUNTER — Ambulatory Visit (HOSPITAL_COMMUNITY)
Admission: RE | Admit: 2014-10-24 | Discharge: 2014-10-24 | Disposition: A | Discharge status: Home / Self Care | Payer: BLUE CROSS/BLUE SHIELD | Source: Ambulatory Visit | Attending: Physician Assistant | Admitting: Physician Assistant

## 2014-10-24 ENCOUNTER — Other Ambulatory Visit (HOSPITAL_COMMUNITY): Payer: Self-pay | Admitting: Internal Medicine

## 2014-10-24 ENCOUNTER — Ambulatory Visit (HOSPITAL_COMMUNITY)
Admission: RE | Admit: 2014-10-24 | Discharge: 2014-10-24 | Disposition: A | Discharge status: Home / Self Care | Payer: BLUE CROSS/BLUE SHIELD | Source: Ambulatory Visit | Attending: Internal Medicine | Admitting: Internal Medicine

## 2014-10-24 DIAGNOSIS — N63 Unspecified lump in unspecified breast: Secondary | ICD-10-CM

## 2014-10-24 DIAGNOSIS — N6320 Unspecified lump in the left breast, unspecified quadrant: Secondary | ICD-10-CM

## 2015-04-24 ENCOUNTER — Encounter: Payer: Self-pay | Admitting: Adult Health

## 2015-04-24 ENCOUNTER — Ambulatory Visit (INDEPENDENT_AMBULATORY_CARE_PROVIDER_SITE_OTHER): Payer: BLUE CROSS/BLUE SHIELD | Admitting: Adult Health

## 2015-04-24 VITALS — BP 140/82 | HR 72 | Ht 62.0 in | Wt 156.0 lb

## 2015-04-24 DIAGNOSIS — Z01419 Encounter for gynecological examination (general) (routine) without abnormal findings: Secondary | ICD-10-CM

## 2015-04-24 DIAGNOSIS — R42 Dizziness and giddiness: Secondary | ICD-10-CM

## 2015-04-24 DIAGNOSIS — H8111 Benign paroxysmal vertigo, right ear: Secondary | ICD-10-CM

## 2015-04-24 DIAGNOSIS — H8110 Benign paroxysmal vertigo, unspecified ear: Secondary | ICD-10-CM | POA: Insufficient documentation

## 2015-04-24 HISTORY — DX: Benign paroxysmal vertigo, unspecified ear: H81.10

## 2015-04-24 HISTORY — DX: Dizziness and giddiness: R42

## 2015-04-24 MED ORDER — DIAZEPAM 2 MG PO TABS: ORAL_TABLET | ORAL | Status: DC

## 2015-04-24 NOTE — Progress Notes (Signed)
Patient ID: [name], female   DOB: 11-12-1980, 34 y.o.   MRN: [id number] History of Present Illness: [name] is a 34 year old white female, in for well woman gyn exam, she is sp hysterectomy for endometriosis. She complains of dizziness and shaky spells for about 3 weeks, has nausea at times and top lip and all finger tips feel numb at times, and has low BP at times, tried to see PCP, but had 3-4 week wait. PCP is [facility], sees [place], [place].   Current Medications, Allergies, Past Medical History, Past Surgical History, Family History and Social History were reviewed in [employer] record.     Review of Systems:  Patient denies any daily headaches, hearing loss, fatigue, blurred vision, shortness of breath, chest pain, abdominal pain, problems with bowel movements, urination, or intercourse(sex hurts at times). No joint pain or mood swings.See HPI for positives.    Physical Exam:BP 140/82 mmHg  Pulse 72  Ht 5\' 2"  (1.575 m)  Wt 156 lb (70.761 kg)  BMI 28.53 kg/m2 standing BP 110/70 General:  Well developed, well nourished, no acute distress Skin:  Warm and dry, CN 2-12 intact, Romberg negative, can tandem walk with out problems, has nystagmus bilaterally and ?+Dix-Hallpike, left ear mildly red,notices more dizziness when turns to right, esp when playing piano at church  Neck:  Midline trachea, normal thyroid, good ROM, no lymphadenopathy Lungs; Clear to auscultation bilaterally Breast:  No dominant palpable mass, retraction, or nipple discharge, has regular irregularities, sp breast reduction, has well healed scars Cardiovascular: Regular rate and rhythm Abdomen:  Soft, non tender, no hepatosplenomegaly Pelvic:  External genitalia is normal in appearance, no lesions.  The vagina has decreased color, moisture and rugae. Urethra has no lesions or masses. The cervix and uterus are absent.  No adnexal masses or tenderness noted.Bladder is non tender, no masses  felt. Extremities/musculoskeletal:  No swelling or varicosities noted, no clubbing or cyanosis Psych:  No mood changes, alert and cooperative,seems happy Has lost 60 lbs on paleo diet.Discussed vertigo with pt and move slowly, do not drive if dizzy, will try valium, and check labs, and follow up in 1 week if not better will get CT.  Impression:  Well woman gyn exam no pap Dizziness Vertigo    Plan: Check CBC,CMP,TSH Rx valium 2 mg #30 take 1 every hours as needed for vertigo, no refills Return in 1 week for F/U if not better will get CT Physical in 1 year Mammogram at 39 Review handout on vertigo

## 2015-04-24 NOTE — Patient Instructions (Addendum)
Benign Positional Vertigo Vertigo means you feel like you or your surroundings are moving when they are not. Benign positional vertigo is the most common form of vertigo. Benign means that the cause of your condition is not serious. Benign positional vertigo is more common in older adults. CAUSES  Benign positional vertigo is the result of an upset in the labyrinth system. This is an area in the middle ear that helps control your balance. This may be caused by a viral infection, head injury, or repetitive motion. However, often no specific cause is found. SYMPTOMS  Symptoms of benign positional vertigo occur when you move your head or eyes in different directions. Some of the symptoms may include:  Loss of balance and falls.  Vomiting.  Blurred vision.  Dizziness.  Nausea.  Involuntary eye movements (nystagmus). DIAGNOSIS  Benign positional vertigo is usually diagnosed by physical exam. If the specific cause of your benign positional vertigo is unknown, your caregiver may perform imaging tests, such as magnetic resonance imaging (MRI) or computed tomography (CT). TREATMENT  Your caregiver may recommend movements or procedures to correct the benign positional vertigo. Medicines such as meclizine, benzodiazepines, and medicines for nausea may be used to treat your symptoms. In rare cases, if your symptoms are caused by certain conditions that affect the inner ear, you may need surgery. HOME CARE INSTRUCTIONS   Follow your caregiver's instructions.  Move slowly. Do not make sudden body or head movements.  Avoid driving.  Avoid operating heavy machinery.  Avoid performing any tasks that would be dangerous to you or others during a vertigo episode.  Drink enough fluids to keep your urine clear or pale yellow. SEEK IMMEDIATE MEDICAL CARE IF:   You develop problems with walking, weakness, numbness, or using your arms, hands, or legs.  You have difficulty speaking.  You develop  severe headaches.  Your nausea or vomiting continues or gets worse.  You develop visual changes.  Your family or friends notice any behavioral changes.  Your condition gets worse.  You have a fever.  You develop a stiff neck or sensitivity to light. MAKE SURE YOU:   Understand these instructions.  Will watch your condition.  Will get help right away if you are not doing well or get worse. Document Released: 06/02/2006 Document Revised: 11/17/2011 Document Reviewed: 05/15/2011 [facility] Patient Information 2015 [place], [place]. This information is not intended to replace advice given to you by your health care provider. Make sure you discuss any questions you have with your health care provider. Take valium  Follow up in 1 week Physical in 1 year mammogram at 39

## 2015-04-25 ENCOUNTER — Telehealth: Payer: Self-pay | Admitting: Adult Health

## 2015-04-25 LAB — COMPREHENSIVE METABOLIC PANEL
A/G RATIO: 2.5 (ref 1.1–2.5)
ALBUMIN: 4.7 g/dL (ref 3.5–5.5)
ALT: 19 IU/L (ref 0–32)
AST: 20 IU/L (ref 0–40)
Alkaline Phosphatase: 63 IU/L (ref 39–117)
BILIRUBIN TOTAL: 0.6 mg/dL (ref 0.0–1.2)
BUN / CREAT RATIO: 19 (ref 8–20)
BUN: 18 mg/dL (ref 6–20)
CHLORIDE: 103 mmol/L (ref 97–108)
CO2: 21 mmol/L (ref 18–29)
Calcium: 9.3 mg/dL (ref 8.7–10.2)
Creatinine, Ser: 0.97 mg/dL (ref 0.57–1.00)
GFR, EST AFRICAN AMERICAN: 88 mL/min/{1.73_m2} (ref >59)
GFR, EST NON AFRICAN AMERICAN: 76 mL/min/{1.73_m2} (ref >59)
GLUCOSE: 81 mg/dL (ref 65–99)
Globulin, Total: 1.9 g/dL (ref 1.5–4.5)
Potassium: 3.6 mmol/L (ref 3.5–5.2)
Sodium: 143 mmol/L (ref 134–144)
TOTAL PROTEIN: 6.6 g/dL (ref 6.0–8.5)

## 2015-04-25 LAB — CBC
HEMOGLOBIN: 14 g/dL (ref 11.1–15.9)
Hematocrit: 42.3 % (ref 34.0–46.6)
MCH: 30.2 pg (ref 26.6–33.0)
MCHC: 33.1 g/dL (ref 31.5–35.7)
MCV: 91 fL (ref 79–97)
Platelets: 237 10*3/uL (ref 150–379)
RBC: 4.63 x10E6/uL (ref 3.77–5.28)
RDW: 13 % (ref 12.3–15.4)
WBC: 7.9 10*3/uL (ref 3.4–10.8)

## 2015-04-25 LAB — TSH: TSH: 1.44 u[IU]/mL (ref 0.450–4.500)

## 2015-04-25 NOTE — Telephone Encounter (Signed)
Pt aware of labs, valium helped a little bit

## 2015-04-25 NOTE — Telephone Encounter (Signed)
Left message labs

## 2015-05-02 ENCOUNTER — Ambulatory Visit (INDEPENDENT_AMBULATORY_CARE_PROVIDER_SITE_OTHER): Payer: BLUE CROSS/BLUE SHIELD | Admitting: Adult Health

## 2015-05-02 ENCOUNTER — Encounter: Payer: Self-pay | Admitting: Adult Health

## 2015-05-02 VITALS — BP 130/70 | HR 72 | Ht 62.0 in | Wt 158.0 lb

## 2015-05-02 DIAGNOSIS — H8111 Benign paroxysmal vertigo, right ear: Secondary | ICD-10-CM

## 2015-05-02 NOTE — Patient Instructions (Signed)
Continue valium  Will get CT of head

## 2015-05-02 NOTE — Progress Notes (Signed)
Subjective:     Patient ID: [name], female   DOB: 11-23-1980, 34 y.o.   MRN: [id number]  HPI [name] is a 34 year old white female, married back in follow up of trying valium to help with vertigo, is better but still dizzy at times, esp when brushing teeth.  Review of Systems Patient denies any daily headaches, hearing loss, fatigue, blurred vision, shortness of breath, chest pain, abdominal pain, problems with bowel movements, urination, or intercourse. No joint pain or mood swings.Still has some dizziness Reviewed past medical,surgical, social and family history. Reviewed medications and allergies.     Objective:   Physical Exam BP 130/70 mmHg  Pulse 72  Ht 5\' 2"  (1.575 m)  Wt 158 lb (71.668 kg)  BMI 28.89 kg/m2   Talk only for 10 minutes, she feels better but will get CT to assess and she agrees,she says that left side of face gets numb sometimes when has migraines, has never seen neurologists.  Assessment:     Vertigo     Plan:    Continue valium Will get CT of head, scheduled for 8/29 at 5:15 at [facility], will get Angie to precert

## 2015-05-03 ENCOUNTER — Telehealth: Payer: Self-pay | Admitting: Adult Health

## 2015-05-03 NOTE — Telephone Encounter (Signed)
Pt aware CT 8/29 at 5:15 at [facility]

## 2015-05-07 ENCOUNTER — Ambulatory Visit (HOSPITAL_COMMUNITY)
Admission: RE | Admit: 2015-05-07 | Discharge: 2015-05-07 | Disposition: A | Discharge status: Home / Self Care | Payer: BLUE CROSS/BLUE SHIELD | Source: Ambulatory Visit | Attending: Adult Health | Admitting: Adult Health

## 2015-05-07 DIAGNOSIS — R110 Nausea: Secondary | ICD-10-CM | POA: Diagnosis not present

## 2015-05-07 DIAGNOSIS — R42 Dizziness and giddiness: Secondary | ICD-10-CM | POA: Insufficient documentation

## 2015-05-07 DIAGNOSIS — R200 Anesthesia of skin: Secondary | ICD-10-CM | POA: Insufficient documentation

## 2015-05-07 DIAGNOSIS — H8111 Benign paroxysmal vertigo, right ear: Secondary | ICD-10-CM

## 2015-05-08 ENCOUNTER — Telehealth: Payer: Self-pay | Admitting: Adult Health

## 2015-05-08 NOTE — Telephone Encounter (Signed)
Pt aware CT normal will refer to [facility] neurological for evaluation.

## 2015-06-04 ENCOUNTER — Ambulatory Visit (INDEPENDENT_AMBULATORY_CARE_PROVIDER_SITE_OTHER): Payer: BLUE CROSS/BLUE SHIELD | Admitting: Neurology

## 2015-06-04 ENCOUNTER — Encounter: Payer: Self-pay | Admitting: Neurology

## 2015-06-04 VITALS — BP 125/80 | HR 63 | Ht 62.0 in | Wt 162.0 lb

## 2015-06-04 DIAGNOSIS — H9193 Unspecified hearing loss, bilateral: Secondary | ICD-10-CM | POA: Diagnosis not present

## 2015-06-04 DIAGNOSIS — R42 Dizziness and giddiness: Secondary | ICD-10-CM

## 2015-06-04 NOTE — Progress Notes (Signed)
PATIENT: [name] DOB: 02-26-1981  Chief Complaint  Patient presents with  . Dizziness    Orthostatics: Lying 112/74, 59 - Sitting 117/77, 59 - Standing 121/77, 62.  Reports having a history of migraines where she usually has left-sided facial numbness.  Now her facial numbness is staying even after her migraine has resolved.  Says her intermittent dizziness worsens with postion changes, especially when getting out ot the bed in the mornings, and when looking to the right.  She has had ringing in her ears for years and she has noticed some mild right-sided hearing loss.     HISTORICAL  [name] is a 34 years old right-handed female, seen in refer by her primary care doctor  Redmond School, MD for evaluation of dizziness, left facial numbness, hearing loss, unsteady gait  She had long-standing history of migraine, has been taking Topamax 200 mg every night since 2010 which did help her migraine some  She noticed gradual onset symptoms around 2013, she began to notice gradually worsening hearing loss especially her right ear, she worked at customer service, taking order through the phone, she has difficulty hearing through her right ear.   Since July 2016, she noticed worsening bilateral ear tinnitus, hearing loss, left worse than right, she also noticed gradual onset dizziness, counterclockwise spinning, vertigo, unsteady gait, getting worse turning her head to the right side, sometimes unsteady gait to the point of fall, bumped into door frame, she was giving a short course of Valium, which helps,   I have personally reviewed CAT scan of the brain in May 07 2015 was normal  Laboratory April 25 2015, showed normal CBC, CMP, TSH,   REVIEW OF SYSTEMS: Full 14 system review of systems performed and notable only for fatigue, hearing loss, ringing ears, spinning sensation, blurred vision, easy bruising, headaches, numbness, dizziness  ALLERGIES: Allergies  Allergen  Reactions  . Latex     Breaking out and will affect breathing  . Codeine Nausea And Vomiting  . Diclofenac Sodium   . Diclofenac Sodium   . Diphenhydramine Hcl   . Doxycycline     Increases blood pressure and respiratory rate Chest pain  . Hydrocodone-Acetaminophen   . Ibuprofen   . Naproxen   . Propoxyphene N-Acetaminophen   . Pseudoephedrine   . Relpax [Eletriptan Hydrobromide]     HOME MEDICATIONS: Current Outpatient Prescriptions  Medication Sig Dispense Refill  . albuterol (PROVENTIL HFA;VENTOLIN HFA) 108 (90 BASE) MCG/ACT inhaler Inhale 2 puffs into the lungs every 6 (six) hours as needed. Shortness of breath    . diazepam (VALIUM) 2 MG tablet Take 1 every 8 hours for vertigo as needed 30 tablet 0  . Diphenhydramine-APAP, sleep, (EXCEDRIN PM PO) Take by mouth as needed.    Marland Kitchen EPINEPHrine (EPI-PEN) 0.3 mg/0.3 mL DEVI Inject 0.3 mg into the muscle as needed. allergies    . simvastatin (ZOCOR) 10 MG tablet Take 10 mg by mouth at bedtime.      . topiramate (TOPAMAX) 100 MG tablet Take 200 mg by mouth at bedtime.      No current facility-administered medications for this visit.    PAST MEDICAL HISTORY: Past Medical History  Diagnosis Date  . GERD (gastroesophageal reflux disease)   . Hypothyroidism   . Hyperlipidemia   . Migraines   . PONV (postoperative nausea and vomiting)   . Asthma   . Pneumonia   . Dizziness 04/24/2015  . Vertigo, benign positional 04/24/2015  PAST SURGICAL HISTORY: Past Surgical History  Procedure Laterality Date  . Total abdominal hysterectomy    . Breast reduction surgery    . Knee arthroscopy      bilateral  . Carpal tunnel release      both hands  . Sinus surgery with instatrak    . Laprscopy      FAMILY HISTORY: Family History  Problem Relation Age of Onset  . COPD Mother   . Diabetes Mother   . Thyroid disease Mother   . Diabetes Brother   . Diabetes Maternal Grandmother   . Heart disease Maternal Grandmother   . Other  Maternal Grandmother     a fib  . Diabetes Paternal Grandmother   . Hypertension Paternal Grandmother   . Hypertension Paternal Grandfather   . Heart attack Paternal Grandfather   . Aneurysm Paternal Grandfather   . Aneurysm Father   . Hypertension Father   . Breast cancer Paternal Grandmother   . Alzheimer's disease Paternal Grandfather   . Parkinson's disease Maternal Grandfather   . Sarcoidosis Sister     SOCIAL HISTORY:  Social History   Social History  . Marital Status: Married    Spouse Name: N/A  . Number of Children: 1  . Years of Education: 16   Occupational History  . Cashier/Cook    Social History Main Topics  . Smoking status: Never Smoker   . Smokeless tobacco: Never Used  . Alcohol Use: No  . Drug Use: No  . Sexual Activity: Yes    Birth Control/ Protection: Surgical     Comment: hyst   Other Topics Concern  . Not on file   Social History Narrative   Lives at home with husband and daughter.   Right-handed.   1 cup caffeine daily.    PHYSICAL EXAM   Filed Vitals:   06/04/15 1519  BP: 125/80  Pulse: 63  Height: 5\' 2"  (1.575 m)  Weight: 162 lb (73.483 kg)    Not recorded      Body mass index is 29.62 kg/(m^2).  PHYSICAL EXAMNIATION:  Gen: NAD, conversant, well nourised, obese, well groomed                     Cardiovascular: Regular rate rhythm, no peripheral edema, warm, nontender. Eyes: Conjunctivae clear without exudates or hemorrhage Neck: Supple, no carotid bruise. Pulmonary: Clear to auscultation bilaterally   NEUROLOGICAL EXAM:  MENTAL STATUS: Speech:    Speech is normal; fluent and spontaneous with normal comprehension.  Cognition:     Orientation to time, place and person     Normal recent and remote memory     Normal Attention span and concentration     Normal Language, naming, repeating,spontaneous speech     Fund of knowledge   CRANIAL NERVES: CN II: Visual fields are full to confrontation. Fundoscopic exam is  normal with sharp discs and no vascular changes. Pupils are round equal and briskly reactive to light. CN III, IV, VI: extraocular movement are normal. No ptosis. Mild horizontal nystagmus at the end gaze, to gaze direction  CN V: Corneal responses are intact she has decreased light touch at left V 1 2 3  ,  CN VII: She has mild left eye-closure weakness  CN VIII: decreased hearing at left side, air conduction more than bony conduction  CN IX, X: Palate elevates symmetrically. Phonation is normal. CN XI: Head turning and shoulder shrug are intact CN XII: Tongue is midline with normal  movements and no atrophy.  MOTOR: There is no pronator drift of out-stretched arms. Muscle bulk and tone are normal. Muscle strength is normal.  REFLEXES: Reflexes are 2+ and symmetric at the biceps, triceps, knees, and ankles. Plantar responses are flexor.  SENSORY: Intact to light touch, pinprick, position sense, and vibration sense are intact in fingers and toes.  COORDINATION: Rapid alternating movements and fine finger movements are intact. There is no dysmetria on finger-to-nose and heel-knee-shin.    GAIT/STANCE: Posture is normal. Gait is steady with normal steps, base, arm swing, and turning. Heel and toe walking are normal. Tandem gait is normal.  Romberg is absent.   DIAGNOSTIC DATA (LABS, IMAGING, TESTING) - I reviewed patient records, labs, notes, testing and imaging myself where available.   ASSESSMENT AND PLAN  [name] is a 34 y.o. female   Vertigo, nystagmus, hearing loss  Potential hypo-activity of left vestibular system  MRI of the brain with without contrast  Valium as needed,  Return to clinic in 2 weeks   Marcial Pacas, M.D. Ph.D.  [facility] [address] [place], [place] [address] Ph: [phone] Fax: [phone]  CC: Redmond School, MD

## 2015-06-20 ENCOUNTER — Ambulatory Visit
Admission: RE | Admit: 2015-06-20 | Discharge: 2015-06-20 | Disposition: A | Discharge status: Home / Self Care | Payer: BLUE CROSS/BLUE SHIELD | Source: Ambulatory Visit | Attending: Neurology | Admitting: Neurology

## 2015-06-20 DIAGNOSIS — R42 Dizziness and giddiness: Secondary | ICD-10-CM

## 2015-06-20 DIAGNOSIS — H9193 Unspecified hearing loss, bilateral: Secondary | ICD-10-CM

## 2015-06-20 MED ORDER — GADOBENATE DIMEGLUMINE 529 MG/ML IV SOLN
14.0000 mL | Freq: Once | INTRAVENOUS | Status: AC | PRN
  Administered 2015-06-20: 14 mL via INTRAVENOUS

## 2015-06-25 ENCOUNTER — Telehealth: Payer: Self-pay | Admitting: *Deleted

## 2015-06-25 NOTE — Telephone Encounter (Signed)
Spoke to [name] - she will keep her appt on 06/27/15 to further discuss MRI and symptoms.

## 2015-06-25 NOTE — Telephone Encounter (Signed)
Patient called back regarding normal results. Patient would like to know what Dr. Krista Blue thinks might be wrong since the results are normal and she is continuing to have symptoms? Patient is "very frustrated". Please call to advise [phone].

## 2015-06-25 NOTE — Telephone Encounter (Signed)
-----   Message from Marcial Pacas, MD sent at 06/22/2015  9:09 AM EDT ----- Please call pt for normal MRI brain.

## 2015-06-25 NOTE — Telephone Encounter (Signed)
Patient aware of normal results

## 2015-06-27 ENCOUNTER — Ambulatory Visit (INDEPENDENT_AMBULATORY_CARE_PROVIDER_SITE_OTHER): Payer: BLUE CROSS/BLUE SHIELD | Admitting: Neurology

## 2015-06-27 ENCOUNTER — Encounter: Payer: Self-pay | Admitting: Neurology

## 2015-06-27 VITALS — BP 105/72 | HR 68 | Ht 62.0 in | Wt 162.0 lb

## 2015-06-27 DIAGNOSIS — R42 Dizziness and giddiness: Secondary | ICD-10-CM

## 2015-06-27 DIAGNOSIS — H9190 Unspecified hearing loss, unspecified ear: Secondary | ICD-10-CM | POA: Diagnosis not present

## 2015-06-27 MED ORDER — RIZATRIPTAN BENZOATE 5 MG PO TBDP: 5.0000 mg | ORAL_TABLET | ORAL | Status: DC | PRN

## 2015-06-27 NOTE — Progress Notes (Signed)
Chief Complaint  Patient presents with  . Dizziness    She would like to review her MRI results. She is still having intermittent dizziness.  Her Valium prescription from  04/24/15 has run out.      PATIENT: [name] DOB: 02-Nov-1980  Chief Complaint  Patient presents with  . Dizziness    She would like to review her MRI results. She is still having intermittent dizziness.  Her Valium prescription from  04/24/15 has run out.     HISTORICAL  [name] is a 34 years old right-handed female, seen in refer by her primary care doctor  Redmond School, MD for evaluation of dizziness, left facial numbness, hearing loss, unsteady gait  She had long-standing history of migraine, has been taking Topamax 200 mg every night since 2010 which did help her migraine some  She noticed gradual onset symptoms around 2013, she began to notice gradually worsening hearing loss especially her right ear, she worked at customer service, taking order through the phone, she has difficulty hearing through her right ear.   Since July 2016, she noticed worsening bilateral ear tinnitus, hearing loss, left worse than right, she also noticed gradual onset dizziness, counterclockwise spinning, vertigo, unsteady gait, getting worse turning her head to the right side, sometimes unsteady gait to the point of fall, bumped into door frame, she was giving a short course of Valium, which helps,   I have personally reviewed CAT scan of the brain in May 07 2015 was normal  Laboratory April 25 2015, showed normal CBC, CMP, TSH,  UPDATE June 27 2015: I have personally reviewed MRI of the brain without contrast October 2016, that was normal,  Today she also complains of occasionally migraine headaches, right lateralized severe pounding headache was associated light noise sensitivity, nauseous, consistent with migraine, used to responded well to Midrin, but her insurance would not cover it, Imitrex causes nausea,  Relpax caused elevated blood pressure and heart rate, Excedrin Migraine only help mild-to-moderate headaches.  REVIEW OF SYSTEMS: Full 14 system review of systems performed and notable only for fatigue, hearing loss, ringing ears, spinning sensation, blurred vision, easy bruising, headaches, numbness, dizziness  ALLERGIES: Allergies  Allergen Reactions  . Latex     Breaking out and will affect breathing  . Codeine Nausea And Vomiting  . Diclofenac Sodium   . Diclofenac Sodium   . Diphenhydramine Hcl   . Doxycycline     Increases blood pressure and respiratory rate Chest pain  . Hydrocodone-Acetaminophen   . Ibuprofen   . Naproxen   . Propoxyphene N-Acetaminophen   . Pseudoephedrine   . Relpax [Eletriptan Hydrobromide]     HOME MEDICATIONS: Current Outpatient Prescriptions  Medication Sig Dispense Refill  . albuterol (PROVENTIL HFA;VENTOLIN HFA) 108 (90 BASE) MCG/ACT inhaler Inhale 2 puffs into the lungs every 6 (six) hours as needed. Shortness of breath    . Aspirin-Acetaminophen-Caffeine (EXCEDRIN PO) Take by mouth as needed.    . diazepam (VALIUM) 2 MG tablet Take 1 every 8 hours for vertigo as needed 30 tablet 0  . EPINEPHrine (EPI-PEN) 0.3 mg/0.3 mL DEVI Inject 0.3 mg into the muscle as needed. allergies    . simvastatin (ZOCOR) 10 MG tablet Take 10 mg by mouth at bedtime.      . topiramate (TOPAMAX) 100 MG tablet Take 200 mg by mouth at bedtime.      No current facility-administered medications for this visit.    PAST MEDICAL HISTORY: Past Medical History  Diagnosis Date  . GERD (gastroesophageal reflux disease)   . Hypothyroidism   . Hyperlipidemia   . Migraines   . PONV (postoperative nausea and vomiting)   . Asthma   . Pneumonia   . Dizziness 04/24/2015  . Vertigo, benign positional 04/24/2015    PAST SURGICAL HISTORY: Past Surgical History  Procedure Laterality Date  . Total abdominal hysterectomy    . Breast reduction surgery    . Knee arthroscopy       bilateral  . Carpal tunnel release      both hands  . Sinus surgery with instatrak    . Laprscopy      FAMILY HISTORY: Family History  Problem Relation Age of Onset  . COPD Mother   . Diabetes Mother   . Thyroid disease Mother   . Diabetes Brother   . Diabetes Maternal Grandmother   . Heart disease Maternal Grandmother   . Other Maternal Grandmother     a fib  . Diabetes Paternal Grandmother   . Hypertension Paternal Grandmother   . Hypertension Paternal Grandfather   . Heart attack Paternal Grandfather   . Aneurysm Paternal Grandfather   . Aneurysm Father   . Hypertension Father   . Breast cancer Paternal Grandmother   . Alzheimer's disease Paternal Grandfather   . Parkinson's disease Maternal Grandfather   . Sarcoidosis Sister     SOCIAL HISTORY:  Social History   Social History  . Marital Status: Married    Spouse Name: N/A  . Number of Children: 1  . Years of Education: 16   Occupational History  . Cashier/Cook    Social History Main Topics  . Smoking status: Never Smoker   . Smokeless tobacco: Never Used  . Alcohol Use: No  . Drug Use: No  . Sexual Activity: Yes    Birth Control/ Protection: Surgical     Comment: hyst   Other Topics Concern  . Not on file   Social History Narrative   Lives at home with husband and daughter.   Right-handed.   1 cup caffeine daily.    PHYSICAL EXAM   Filed Vitals:   06/27/15 1656  BP: 105/72  Pulse: 68  Height: 5\' 2"  (1.575 m)  Weight: 162 lb (73.483 kg)    Not recorded      Body mass index is 29.62 kg/(m^2).  PHYSICAL EXAMNIATION:  Gen: NAD, conversant, well nourised, obese, well groomed                     Cardiovascular: Regular rate rhythm, no peripheral edema, warm, nontender. Eyes: Conjunctivae clear without exudates or hemorrhage Neck: Supple, no carotid bruise. Pulmonary: Clear to auscultation bilaterally   NEUROLOGICAL EXAM:  MENTAL STATUS: Speech:    Speech is normal; fluent and  spontaneous with normal comprehension.  Cognition:     Orientation to time, place and person     Normal recent and remote memory     Normal Attention span and concentration     Normal Language, naming, repeating,spontaneous speech     Fund of knowledge   CRANIAL NERVES: CN II: Visual fields are full to confrontation. Fundoscopic exam is normal with sharp discs and no vascular changes. Pupils are round equal and briskly reactive to light. CN III, IV, VI: extraocular movement are normal. No ptosis. Mild horizontal nystagmus at the end gaze, to gaze direction  CN V: Corneal responses are intact she has decreased light touch at left V 1 2 3  ,  CN VII: She has mild left eye-closure weakness  CN VIII: decreased hearing at left side, air conduction more than bony conduction  CN IX, X: Palate elevates symmetrically. Phonation is normal. CN XI: Head turning and shoulder shrug are intact CN XII: Tongue is midline with normal movements and no atrophy.  MOTOR: There is no pronator drift of out-stretched arms. Muscle bulk and tone are normal. Muscle strength is normal.  REFLEXES: Reflexes are 2+ and symmetric at the biceps, triceps, knees, and ankles. Plantar responses are flexor.  SENSORY: Intact to light touch, pinprick, position sense, and vibration sense are intact in fingers and toes.  COORDINATION: Rapid alternating movements and fine finger movements are intact. There is no dysmetria on finger-to-nose and heel-knee-shin.    GAIT/STANCE: Posture is normal. Gait is steady with normal steps, base, arm swing, and turning. Heel and toe walking are normal. Tandem gait is normal.  Romberg is absent.   DIAGNOSTIC DATA (LABS, IMAGING, TESTING) - I reviewed patient records, labs, notes, testing and imaging myself where available.   ASSESSMENT AND PLAN  [name] is a 34 y.o. female    Vertigo, nystagmus, hearing loss  With normal MRI of the brain without contrast, in specific, there  was no pathology found in bilateral internal acoustic canal,  I will refer her to ENT Dr. Gala Lewandowsky for evaluation. Chronic migraine   Continue Topamax as preventive medications  Maxalt as needed  Marcial Pacas, M.D. Ph.D.  [facility] [address] [place], [place] [address] Ph: [phone] Fax: [phone]  CC: Redmond School, MD

## 2015-06-27 NOTE — Patient Instructions (Addendum)
Magnesium oxide 400 mg twice a day Riboflavin 100 mg twice a day    Dr. Vicie Mutters, [facility]  Female [address], [place], [place] [address] Get Directions  [phone]

## 2015-07-12 ENCOUNTER — Telehealth: Payer: Self-pay | Admitting: Neurology

## 2015-07-12 NOTE — Telephone Encounter (Signed)
Patient is calling. She was referred by Dr. Krista Blue to Dr. Janace Hoard an  ENT physician and he told her this was not an inner ear problem but a neurological problem and would need to follow up with Dr. Krista Blue. Dr. Janace Hoard said he would continue to see the patient for hearing loss only. What does  the patient need to do?  Does she need a follow up appointment with Dr. Krista Blue to discuss numbness in her face and dizziness. Please call to discuss. Thank you.

## 2015-07-12 NOTE — Telephone Encounter (Signed)
Spoke to Dr. Krista Blue, she would like to review her ENT records and send her to vestibular rehab.  I will call Dr. Janace Hoard' office for records.  Spoke to Tomi Bamberger - Dr. Janace Hoard has already ordered vestibular rehab and she is waiting to be scheduled.  After PT, if she is not better, she will call back to schedule an earlier follow up with Dr. Krista Blue.

## 2015-09-26 ENCOUNTER — Other Ambulatory Visit (HOSPITAL_COMMUNITY): Payer: Self-pay | Admitting: Internal Medicine

## 2015-09-26 DIAGNOSIS — Z1231 Encounter for screening mammogram for malignant neoplasm of breast: Secondary | ICD-10-CM

## 2015-10-29 ENCOUNTER — Ambulatory Visit (HOSPITAL_COMMUNITY)
Admission: RE | Admit: 2015-10-29 | Discharge: 2015-10-29 | Disposition: A | Discharge status: Home / Self Care | Payer: BLUE CROSS/BLUE SHIELD | Source: Ambulatory Visit | Attending: Internal Medicine | Admitting: Internal Medicine

## 2015-10-29 DIAGNOSIS — Z1231 Encounter for screening mammogram for malignant neoplasm of breast: Secondary | ICD-10-CM

## 2015-12-26 ENCOUNTER — Ambulatory Visit: Payer: BLUE CROSS/BLUE SHIELD | Admitting: Neurology

## 2016-07-14 ENCOUNTER — Encounter (HOSPITAL_COMMUNITY): Payer: Self-pay | Admitting: *Deleted

## 2016-07-14 ENCOUNTER — Emergency Department (HOSPITAL_COMMUNITY)
Admission: EM | Admit: 2016-07-14 | Discharge: 2016-07-14 | Disposition: A | Discharge status: Home / Self Care | Payer: BLUE CROSS/BLUE SHIELD | Attending: Emergency Medicine | Admitting: Emergency Medicine

## 2016-07-14 ENCOUNTER — Emergency Department (HOSPITAL_COMMUNITY): Payer: BLUE CROSS/BLUE SHIELD

## 2016-07-14 DIAGNOSIS — R300 Dysuria: Secondary | ICD-10-CM | POA: Diagnosis not present

## 2016-07-14 DIAGNOSIS — R350 Frequency of micturition: Secondary | ICD-10-CM | POA: Insufficient documentation

## 2016-07-14 DIAGNOSIS — Z79899 Other long term (current) drug therapy: Secondary | ICD-10-CM | POA: Insufficient documentation

## 2016-07-14 DIAGNOSIS — J45909 Unspecified asthma, uncomplicated: Secondary | ICD-10-CM | POA: Insufficient documentation

## 2016-07-14 DIAGNOSIS — E039 Hypothyroidism, unspecified: Secondary | ICD-10-CM | POA: Insufficient documentation

## 2016-07-14 DIAGNOSIS — R109 Unspecified abdominal pain: Secondary | ICD-10-CM

## 2016-07-14 DIAGNOSIS — R3915 Urgency of urination: Secondary | ICD-10-CM | POA: Diagnosis not present

## 2016-07-14 DIAGNOSIS — Z7982 Long term (current) use of aspirin: Secondary | ICD-10-CM | POA: Insufficient documentation

## 2016-07-14 LAB — URINALYSIS, ROUTINE W REFLEX MICROSCOPIC
BILIRUBIN URINE: NEGATIVE
Glucose, UA: NEGATIVE mg/dL
HGB URINE DIPSTICK: NEGATIVE
Ketones, ur: NEGATIVE mg/dL
Leukocytes, UA: NEGATIVE
NITRITE: NEGATIVE
PROTEIN: NEGATIVE mg/dL
SPECIFIC GRAVITY, URINE: 1.02 (ref 1.005–1.030)
pH: 6 (ref 5.0–8.0)

## 2016-07-14 MED ORDER — PHENAZOPYRIDINE HCL 100 MG PO TABS
200.0000 mg | ORAL_TABLET | Freq: Once | ORAL | Status: AC
  Administered 2016-07-14: 200 mg via ORAL
  Filled 2016-07-14: qty 2

## 2016-07-14 MED ORDER — NITROFURANTOIN MONOHYD MACRO 100 MG PO CAPS: 100.0000 mg | ORAL_CAPSULE | Freq: Two times a day (BID) | ORAL | 0 refills | Status: DC

## 2016-07-14 MED ORDER — PHENAZOPYRIDINE HCL 200 MG PO TABS: 200.0000 mg | ORAL_TABLET | Freq: Three times a day (TID) | ORAL | 0 refills | Status: DC | PRN

## 2016-07-14 MED ORDER — NITROFURANTOIN MONOHYD MACRO 100 MG PO CAPS
100.0000 mg | ORAL_CAPSULE | Freq: Two times a day (BID) | ORAL | Status: DC
  Administered 2016-07-14: 100 mg via ORAL
  Filled 2016-07-14: qty 1

## 2016-07-14 NOTE — ED Provider Notes (Signed)
[facility] DEPT Provider Note   CSN: [id number] Arrival date & time: 07/14/16  0526     History   Chief Complaint Chief Complaint  Patient presents with  . Flank Pain    HPI [name] is a 35 y.o. female.  Patient reports that [name] started having urinary frequency, dysuria and urgency with right-sided back pain when [name] urinated yesterday. Symptoms worsened through the day and this morning [name] was experiencing severe pain in her back, could not get to work because of the pain. No fever. [name] has had nausea,  no vomiting or diarrhea.      Past Medical History:  Diagnosis Date  . Asthma   . Dizziness 04/24/2015  . GERD (gastroesophageal reflux disease)   . Hyperlipidemia   . Hypothyroidism   . Migraines   . Pneumonia   . PONV (postoperative nausea and vomiting)   . Vertigo, benign positional 04/24/2015    Patient Active Problem List   Diagnosis Date Noted  . Dizziness 04/24/2015  . Vertigo, benign positional 04/24/2015  . Abdominal pain, acute, right upper quadrant 08/24/2012  . Pneumonia 08/03/2011  . GERD (gastroesophageal reflux disease) 06/25/2011  . Hyperlipidemia 06/25/2011  . OTHER AFFECTIONS OF SHOULDER REGION NEC 09/27/2007    Past Surgical History:  Procedure Laterality Date  . BREAST REDUCTION SURGERY    . CARPAL TUNNEL RELEASE     both hands  . KNEE ARTHROSCOPY     bilateral  . laprscopy    . SINUS SURGERY WITH INSTATRAK    . TOTAL ABDOMINAL HYSTERECTOMY      OB History    Gravida Para Term Preterm AB Living   2 1     1 1    SAB TAB Ectopic Multiple Live Births   1               Home Medications    Prior to Admission medications   Medication Sig Start Date End Date Taking? Authorizing Provider  albuterol (PROVENTIL HFA;VENTOLIN HFA) 108 (90 BASE) MCG/ACT inhaler Inhale 2 puffs into the lungs every 6 (six) hours as needed. Shortness of breath   Yes Historical Provider, MD  Aspirin-Acetaminophen-Caffeine (EXCEDRIN PO) Take by mouth  as needed.   Yes Historical Provider, MD  EPINEPHrine (EPI-PEN) 0.3 mg/0.3 mL DEVI Inject 0.3 mg into the muscle as needed. allergies   Yes Historical Provider, MD  simvastatin (ZOCOR) 10 MG tablet Take 10 mg by mouth at bedtime.     Yes Historical Provider, MD  topiramate (TOPAMAX) 100 MG tablet Take 200 mg by mouth at bedtime.    Yes Historical Provider, MD  diazepam (VALIUM) 2 MG tablet Take 1 every 8 hours for vertigo as needed 04/24/15   Estill Dooms, NP  rizatriptan (MAXALT-MLT) 5 MG disintegrating tablet Take 1 tablet (5 mg total) by mouth as needed. May repeat in 2 hours if needed 06/27/15   Marcial Pacas, MD    Family History Family History  Problem Relation Age of Onset  . COPD Mother   . Diabetes Mother   . Thyroid disease Mother   . Aneurysm Father   . Hypertension Father   . Diabetes Brother   . Diabetes Maternal Grandmother   . Heart disease Maternal Grandmother   . Other Maternal Grandmother     a fib  . Parkinson's disease Maternal Grandfather   . Diabetes Paternal Grandmother   . Hypertension Paternal Grandmother   . Breast cancer Paternal Grandmother   . Hypertension Paternal Grandfather   .  Heart attack Paternal Grandfather   . Aneurysm Paternal Grandfather   . Alzheimer's disease Paternal Grandfather   . Sarcoidosis Sister     Social History Social History  Substance Use Topics  . Smoking status: Never Smoker  . Smokeless tobacco: Never Used  . Alcohol use No     Allergies   Latex; Codeine; Diclofenac sodium; Diclofenac sodium; Diphenhydramine hcl; Doxycycline; Hydrocodone-acetaminophen; Ibuprofen; Naproxen; Propoxyphene n-acetaminophen; Pseudoephedrine; and Relpax [eletriptan hydrobromide]   Review of Systems Review of Systems  Genitourinary: Positive for decreased urine volume, dysuria, flank pain, frequency and urgency.  All other systems reviewed and are negative.    Physical Exam Updated Vital Signs BP 129/86 (BP Location: Left Arm)    Pulse 71   Temp 98.1 F (36.7 C) (Oral)   Resp 18   Ht 5\' 2"  (1.575 m)   Wt 160 lb (72.6 kg)   SpO2 100%   BMI 29.26 kg/m   Physical Exam  Constitutional: [name] is oriented to person, place, and time. [name] appears well-developed and well-nourished. No distress.  HENT:  Head: Normocephalic and atraumatic.  Right Ear: Hearing normal.  Left Ear: Hearing normal.  Nose: Nose normal.  Mouth/Throat: Oropharynx is clear and moist and mucous membranes are normal.  Eyes: Conjunctivae and EOM are normal. Pupils are equal, round, and reactive to light.  Neck: Normal range of motion. Neck supple.  Cardiovascular: Regular rhythm, S1 normal and S2 normal.  Exam reveals no gallop and no friction rub.   No murmur heard. Pulmonary/Chest: Effort normal and breath sounds normal. No respiratory distress. [name] exhibits no tenderness.  Abdominal: Soft. Normal appearance and bowel sounds are normal. There is no hepatosplenomegaly. There is no tenderness. There is no rebound, no guarding, no tenderness at McBurney's point and negative Murphy's sign. No hernia.  Musculoskeletal: Normal range of motion.  Neurological: [name] is alert and oriented to person, place, and time. [name] has normal strength. No cranial nerve deficit or sensory deficit. Coordination normal. GCS eye subscore is 4. GCS verbal subscore is 5. GCS motor subscore is 6.  Skin: Skin is warm, dry and intact. No rash noted. No cyanosis.  Psychiatric: [name] has a normal mood and affect. Her speech is normal and behavior is normal. Thought content normal.  Nursing note and vitals reviewed.    ED Treatments / Results  Labs (all labs ordered are listed, but only abnormal results are displayed) Labs Reviewed  URINALYSIS, ROUTINE W REFLEX MICROSCOPIC (NOT AT [facility])    EKG  EKG Interpretation None       Radiology No results found.  Procedures Procedures (including critical care time)  Medications Ordered in ED Medications - No data to  display   Initial Impression / Assessment and Plan / ED Course  I have reviewed the triage vital signs and the nursing notes.  Pertinent labs & imaging results that were available during my care of the patient were reviewed by me and considered in my medical decision making (see chart for details).  Clinical Course   Patient presents to the emergency department for evaluation of UTI type symptoms. Patient experiencing frequency, dysuria, urgency, lower urine volume with pain in her back that worsens when [name] urinates. Her urinalysis was essentially normal. Symptoms, however, are compelling. Will treat empirically. Pain might be musculoskeletal, will treat for musculoskeletal pain as well. No concern for kidney stone.  Final Clinical Impressions(s) / ED Diagnoses   Final diagnoses:  Flank pain  Dysuria    New Prescriptions New  Prescriptions   No medications on file     Orpah Greek, MD 07/14/16 [phone]

## 2016-07-14 NOTE — ED Triage Notes (Signed)
Pt c/o right flank pain with nausea, burning with urination that started yesterday

## 2017-04-14 ENCOUNTER — Ambulatory Visit (HOSPITAL_COMMUNITY)
Admission: RE | Admit: 2017-04-14 | Discharge: 2017-04-14 | Disposition: A | Discharge status: Home / Self Care | Payer: BLUE CROSS/BLUE SHIELD | Source: Ambulatory Visit | Attending: Family Medicine | Admitting: Family Medicine

## 2017-04-14 ENCOUNTER — Other Ambulatory Visit: Payer: Self-pay | Admitting: Family Medicine

## 2017-04-14 DIAGNOSIS — G43D0 Abdominal migraine, not intractable: Secondary | ICD-10-CM | POA: Diagnosis present

## 2017-04-15 ENCOUNTER — Emergency Department (HOSPITAL_COMMUNITY)
Admission: EM | Admit: 2017-04-15 | Discharge: 2017-04-15 | Disposition: A | Discharge status: Home / Self Care | Payer: BLUE CROSS/BLUE SHIELD | Attending: Emergency Medicine | Admitting: Emergency Medicine

## 2017-04-15 ENCOUNTER — Encounter (HOSPITAL_COMMUNITY): Payer: Self-pay | Admitting: Emergency Medicine

## 2017-04-15 ENCOUNTER — Emergency Department (HOSPITAL_COMMUNITY): Payer: BLUE CROSS/BLUE SHIELD

## 2017-04-15 DIAGNOSIS — R200 Anesthesia of skin: Secondary | ICD-10-CM | POA: Diagnosis present

## 2017-04-15 DIAGNOSIS — G43101 Migraine with aura, not intractable, with status migrainosus: Secondary | ICD-10-CM

## 2017-04-15 DIAGNOSIS — G43901 Migraine, unspecified, not intractable, with status migrainosus: Secondary | ICD-10-CM | POA: Diagnosis not present

## 2017-04-15 HISTORY — DX: Pure hypercholesterolemia, unspecified: E78.00

## 2017-04-15 HISTORY — DX: Unspecified asthma, uncomplicated: J45.909

## 2017-04-15 HISTORY — DX: Endometriosis, unspecified: N80.9

## 2017-04-15 HISTORY — DX: Disorder of thyroid, unspecified: E07.9

## 2017-04-15 MED ORDER — KETOROLAC TROMETHAMINE 30 MG/ML IJ SOLN
30.0000 mg | Freq: Once | INTRAMUSCULAR | Status: AC
  Administered 2017-04-15: 30 mg via INTRAVENOUS
  Filled 2017-04-15: qty 1

## 2017-04-15 MED ORDER — METOCLOPRAMIDE HCL 5 MG/ML IJ SOLN
10.0000 mg | Freq: Once | INTRAMUSCULAR | Status: DC
  Filled 2017-04-15: qty 2

## 2017-04-15 MED ORDER — VALPROATE SODIUM 500 MG/5ML IV SOLN: 500.0000 mg | Freq: Once | INTRAVENOUS | Status: DC

## 2017-04-15 MED ORDER — GADOBENATE DIMEGLUMINE 529 MG/ML IV SOLN
15.0000 mL | Freq: Once | INTRAVENOUS | Status: AC | PRN
  Administered 2017-04-15: 15 mL via INTRAVENOUS

## 2017-04-15 MED ORDER — PROMETHAZINE HCL 25 MG/ML IJ SOLN
12.5000 mg | Freq: Once | INTRAMUSCULAR | Status: AC
  Administered 2017-04-15: 12.5 mg via INTRAVENOUS
  Filled 2017-04-15: qty 1

## 2017-04-15 MED ORDER — LORAZEPAM 2 MG/ML IJ SOLN
1.0000 mg | Freq: Once | INTRAMUSCULAR | Status: AC
  Administered 2017-04-15: 1 mg via INTRAVENOUS
  Filled 2017-04-15: qty 1

## 2017-04-15 NOTE — ED Notes (Signed)
See EDP assessment 

## 2017-04-15 NOTE — ED Provider Notes (Signed)
Pt received at sign out from [place] who is awaiting the results of a brain MRI.    Per her history: "36 year old female with a history of completed migraine headaches, who presents to the emergency department with worsening left sided facial numbness from her baseline beginning approximately one week ago. Patient reports that she had an onset of her typical migraine headache approximately one week; however, her baseline left-sided facial numbness, which is typically left sided perinasal and around the left lip has now extended into her left forehead and portions of her scalp. She also notes some associated cold sensation described as a "menthol" sensation over her left-sided scalp as well. Additionally, she notes some recent expressive aphasia, stating that she has been unable to correctly pronounce some words and has been hesitant with her speech. She was seen by her PCP's office yesterday and at that time. She was advised to emergency department for further management. CT head was performed yesterday, which was hard for intracranial abnormalities. At that time she was subsequently advised to come to [facility] for consult w/ Neurology and further management. Of note, pt states that her headache did temporarily resolve three days ago; however, her other symptoms were still present.  She has previously been followed by Dr. Krista Blue of [facility] neurology. She is on Topamax daily and Fioricet for breakthrough pain which she has been taking without significant relief of her migraine headache. She denies fever, chills, vomiting, chest pain, shortness of breath, dizziness, or any other associated symptoms."   Physical Exam  BP 119/66 (BP Location: Right Arm)   Pulse 69   Temp 98.5 F (36.9 C) (Oral)   Resp 16   Ht 5\' 2"  (1.575 m)   Wt 76.2 kg (168 lb)   SpO2 99%   BMI 30.73 kg/m   Physical Exam Patient appears comfortable. No acute distress. Speaking in full sentences. Ambulatory without difficulty.   ED Course   Procedures  MDM 36 year old female with a h/o of complicated migraines presents to the ED for further investigation of her migraines with new neurological deficits. MRI of the head otherwise negative except for stable mild cerebellar tonsillar ectopia. The patient was treated in the ED with Toradol and phenergan, and she reports improvement of the migraine and is ready for discharge. Will d/c the patient to home with follow up to her neurologist. VSS. NAD. Strict return precautions given. The patient is safe for d/c at this time.        Joline Maxcy A, PA-C 04/16/17 0981    Orlie Dakin, MD 04/16/17 1455

## 2017-04-15 NOTE — Discharge Instructions (Signed)
Please follow up closely with your pcp and neurologist, You are having a headache. No specific cause was found today for your headache. It may have been a migraine or other cause of headache. Stress, anxiety, fatigue, and depression are common triggers for headaches. Your headache today does not appear to be life-threatening or require hospitalization, but often the exact cause of headaches is not determined in the emergency department. Therefore, follow-up with your doctor is very important to find out what may have caused your headache, and whether or not you need any further diagnostic testing or treatment. Sometimes headaches can appear benign (not harmful), but then more serious symptoms can develop which should prompt an immediate re-evaluation by your doctor or the emergency department. SEEK MEDICAL ATTENTION IF: You develop possible problems with medications prescribed.  The medications don't resolve your headache, if it recurs , or if you have multiple episodes of vomiting or can't take fluids. You have a change from the usual headache. RETURN IMMEDIATELY IF you develop a sudden, severe headache or confusion, become poorly responsive or faint, develop a fever above 100.60F or problem breathing, have a change in speech, vision, swallowing, or understanding, or develop new weakness, numbness, tingling, incoordination, or have a seizure.

## 2017-04-15 NOTE — ED Triage Notes (Signed)
Pt visited her doctor's office which told her to go to [facility]. Pt had a CT scan done at [facility] that seemed normal. [facility] MD wanted pt to follow up here for further evaluation. Pt reports left sided numbness for the past week and a half on the left side of face and intermittent left arm and leg numbness. Pt reports some speech difficulty and headache as well. Pain 4/10.

## 2017-04-16 ENCOUNTER — Encounter (HOSPITAL_COMMUNITY): Payer: Self-pay | Admitting: *Deleted

## 2017-06-06 NOTE — ED Provider Notes (Signed)
[place] DEPT Provider Note   CSN: [id number] Arrival date & time: 04/15/17  1700     History   Chief Complaint Chief Complaint  Patient presents with  . Numbness    HPI [name] is a 36 year old female with a history of complicated migraine headaches, who presents to the emergency department with worsening left sided facial numbness from her baseline beginning approximately one week ago. Patient reports that she had an onset of her typical migraine headache approximately one week ago; however, her baseline left-sided facial numbness, which is typically left sided perinasal and around the left lip has now extended into her left forehead and portions of her scalp. She also notes some associated cold sensation described as a "menthol" sensation over her left scalp . Additionally, she notes some recent expressive aphasia, stating that she has been unable to correctly pronounce some words and has been hesitant with her speech. She was seen by her PCP's office yesterday and at that time. She was advised [place] to emergency department for further management. CT head was performed yesterday, which was negative for intracranial abnormalities. At that time she was subsequently advised to come to [facility] for consult w/ Neurology and further management. Of note, pt states that her headache did temporarily resolve three days ago; however, her other symptoms were still present.  She has previously been followed by Dr. Krista Blue of [facility] neurology. She is on Topamax daily and Fioricet for breakthrough pain which she has been taking without significant relief of her migraine headache. She denies fever, chills, vomiting, chest pain, shortness of breath, dizziness, or any other associated symptoms.  HPI  Past Medical History:  Diagnosis Date  . Asthma   . Asthma   . Dizziness 04/24/2015  . Elevated cholesterol   . Endometriosis   . GERD (gastroesophageal reflux disease)   . Hyperlipidemia   .  Hypothyroidism   . Migraines   . Pneumonia   . PONV (postoperative nausea and vomiting)   . Thyroid disease    hypothyroid. Hashimoto's dz  . Vertigo, benign positional 04/24/2015    Patient Active Problem List   Diagnosis Date Noted  . Dizziness 04/24/2015  . Vertigo, benign positional 04/24/2015  . Abdominal pain, acute, right upper quadrant 08/24/2012  . Pneumonia 08/03/2011  . GERD (gastroesophageal reflux disease) 06/25/2011  . Hyperlipidemia 06/25/2011  . OTHER AFFECTIONS OF SHOULDER REGION NEC 09/27/2007    Past Surgical History:  Procedure Laterality Date  . BILATERAL KNEE ARTHROSCOPY    . BREAST REDUCTION SURGERY    . CARPAL TUNNEL RELEASE     both hands  . KNEE ARTHROSCOPY     bilateral  . laprscopy    . SINUS SURGERY WITH INSTATRAK    . TOTAL ABDOMINAL HYSTERECTOMY      OB History    Gravida Para Term Preterm AB Living   2 1 0 0 1     SAB TAB Ectopic Multiple Live Births   1 0 0           Home Medications    Prior to Admission medications   Medication Sig Start Date End Date Taking? Authorizing Provider  albuterol (PROVENTIL HFA;VENTOLIN HFA) 108 (90 BASE) MCG/ACT inhaler Inhale 2 puffs into the lungs every 6 (six) hours as needed. Shortness of breath    [provider]  Aspirin-Acetaminophen-Caffeine (EXCEDRIN PO) Take by mouth as needed.    [provider]  diazepam (VALIUM) 2 MG tablet Take 1 every 8 hours for  vertigo as needed 04/24/15   Derrek Monaco A, NP  EPINEPHrine (EPI-PEN) 0.3 mg/0.3 mL DEVI Inject 0.3 mg into the muscle as needed. allergies    [provider]  nitrofurantoin, macrocrystal-monohydrate, (MACROBID) 100 MG capsule Take 1 capsule (100 mg total) by mouth 2 (two) times daily. X 7 days 07/14/16   Orpah Greek, MD  phenazopyridine (PYRIDIUM) 200 MG tablet Take 1 tablet (200 mg total) by mouth 3 (three) times daily as needed for pain. 07/14/16   Orpah Greek, MD  rizatriptan (MAXALT-MLT) 5  MG disintegrating tablet Take 1 tablet (5 mg total) by mouth as needed. May repeat in 2 hours if needed 06/27/15   Marcial Pacas, MD  simvastatin (ZOCOR) 10 MG tablet Take 10 mg by mouth at bedtime.      [provider]  topiramate (TOPAMAX) 100 MG tablet Take 200 mg by mouth at bedtime.     [provider]    Family History Family History  Problem Relation Age of Onset  . COPD Mother   . Diabetes Mother   . Thyroid disease Mother   . Aneurysm Father   . Hypertension Father   . Diabetes Brother   . Diabetes Maternal Grandmother   . Heart disease Maternal Grandmother   . Other Maternal Grandmother        a fib  . Parkinson's disease Maternal Grandfather   . Diabetes Paternal Grandmother   . Hypertension Paternal Grandmother   . Breast cancer Paternal Grandmother   . Hypertension Paternal Grandfather   . Heart attack Paternal Grandfather   . Aneurysm Paternal Grandfather   . Alzheimer's disease Paternal Grandfather   . Sarcoidosis Sister     Social History Social History  Substance Use Topics  . Smoking status: Never Smoker  . Smokeless tobacco: Never Used  . Alcohol use No     Allergies   Latex; Benadryl [diphenhydramine hcl]; Codeine; Diclofenac sodium; Diclofenac sodium; Diphenhydramine hcl; Doxycycline; Doxycycline; Hydrocodone-acetaminophen; Ibuprofen; Maxalt [rizatriptan]; Naproxen; Nsaids; Propoxyphene n-acetaminophen; Pseudoephedrine; Relpax [eletriptan hydrobromide]; Sudafed [pseudoephedrine hcl]; Vicodin [hydrocodone-acetaminophen]; and Voltaren [diclofenac]   Review of Systems Review of Systems Ten systems reviewed and are negative for acute change, except as noted in the HPI.    Physical Exam Updated Vital Signs BP 106/64 (BP Location: Right Arm)   Pulse (!) 57   Temp 98.5 F (36.9 C) (Oral)   Resp 16   Ht 5\' 2"  (1.575 m)   Wt 76.2 kg (168 lb)   SpO2 97%   BMI 30.73 kg/m   Physical Exam  Constitutional: She is oriented to person,  place, and time. She appears well-developed and well-nourished. No distress.  HENT:  Head: Normocephalic and atraumatic.  Mouth/Throat: Oropharynx is clear and moist.  Eyes: Pupils are equal, round, and reactive to light. Conjunctivae and EOM are normal. No scleral icterus.  No horizontal, vertical or rotational nystagmus  Neck: Normal range of motion. Neck supple.  Full active and passive ROM without pain No midline or paraspinal tenderness No nuchal rigidity or meningeal signs  Cardiovascular: Normal rate, regular rhythm and intact distal pulses.   Pulmonary/Chest: Effort normal and breath sounds normal. No respiratory distress. She has no wheezes. She has no rales.  Abdominal: Soft. Bowel sounds are normal. There is no tenderness. There is no rebound and no guarding.  Musculoskeletal: Normal range of motion.  Lymphadenopathy:    She has no cervical adenopathy.  Neurological: She is alert and oriented to person, place, and time. No  cranial nerve deficit. She exhibits normal muscle tone. Coordination normal.  Mental Status:  Alert, oriented, thought content appropriate. Speech fluent without evidence of aphasia. Able to follow 2 step commands without difficulty.  Cranial Nerves:  II:  Peripheral visual fields grossly normal, pupils equal, round, reactive to light III,IV, VI: ptosis not present, extra-ocular motions intact bilaterally  V,VII: smile symmetric, facial light touch sensation equal VIII: hearing grossly normal bilaterally  IX,X: midline uvula rise  XI: bilateral shoulder shrug equal and strong XII: midline tongue extension  Motor:  5/5 in upper and lower extremities bilaterally including strong and equal grip strength and dorsiflexion/plantar flexion Sensory:  Subjective sensory discrepancy to light touvh in the left side of the face only Cerebellar: normal finger-to-nose with bilateral upper extremities Gait: normal gait and balance CV: distal pulses palpable throughout     Skin: Skin is warm and dry. No rash noted. She is not diaphoretic.  Psychiatric: She has a normal mood and affect. Her behavior is normal. Judgment and thought content normal.  Nursing note and vitals reviewed.    ED Treatments / Results  Labs (all labs ordered are listed, but only abnormal results are displayed) Labs Reviewed - No data to display  EKG  EKG Interpretation None       Radiology No results found.  Procedures Procedures (including critical care time)  Medications Ordered in ED Medications  LORazepam (ATIVAN) injection 1 mg (1 mg Intravenous Given 04/15/17 2034)  gadobenate dimeglumine (MULTIHANCE) injection 15 mL (15 mLs Intravenous Contrast Given 04/15/17 2157)  ketorolac (TORADOL) 30 MG/ML injection 30 mg (30 mg Intravenous Given 04/15/17 2247)  promethazine (PHENERGAN) injection 12.5 mg (12.5 mg Intravenous Given 04/15/17 2249)     Initial Impression / Assessment and Plan / ED Course  I have reviewed the triage vital signs and the nursing notes.  Pertinent labs & imaging results that were available during my care of the patient were reviewed by me and considered in my medical decision making (see chart for details).     Patient awaiting MRI brain. I have given sign out to PA McDonald. Pt stable in ED with no significant deterioration in condition.  Final Clinical Impressions(s) / ED Diagnoses   Final diagnoses:  Complicated migraine with status migrainosus    New Prescriptions There are no discharge medications for this patient.    Margarita Mail, PA-C 06/06/17 1039    Pattricia Boss, MD 06/12/17 [phone]

## 2017-06-22 ENCOUNTER — Ambulatory Visit: Payer: BLUE CROSS/BLUE SHIELD | Admitting: Neurology

## 2017-09-09 DIAGNOSIS — G43719 Chronic migraine without aura, intractable, without status migrainosus: Secondary | ICD-10-CM | POA: Diagnosis not present

## 2017-09-09 DIAGNOSIS — G43111 Migraine with aura, intractable, with status migrainosus: Secondary | ICD-10-CM | POA: Diagnosis not present

## 2017-09-09 DIAGNOSIS — G43839 Menstrual migraine, intractable, without status migrainosus: Secondary | ICD-10-CM | POA: Diagnosis not present

## 2017-12-02 DIAGNOSIS — Z1389 Encounter for screening for other disorder: Secondary | ICD-10-CM | POA: Diagnosis not present

## 2017-12-02 DIAGNOSIS — E669 Obesity, unspecified: Secondary | ICD-10-CM | POA: Diagnosis not present

## 2017-12-02 DIAGNOSIS — K900 Celiac disease: Secondary | ICD-10-CM | POA: Diagnosis not present

## 2017-12-02 DIAGNOSIS — Z0001 Encounter for general adult medical examination with abnormal findings: Secondary | ICD-10-CM | POA: Diagnosis not present

## 2017-12-08 DIAGNOSIS — G43719 Chronic migraine without aura, intractable, without status migrainosus: Secondary | ICD-10-CM | POA: Diagnosis not present

## 2017-12-08 DIAGNOSIS — G43111 Migraine with aura, intractable, with status migrainosus: Secondary | ICD-10-CM | POA: Diagnosis not present

## 2017-12-08 DIAGNOSIS — G43839 Menstrual migraine, intractable, without status migrainosus: Secondary | ICD-10-CM | POA: Diagnosis not present

## 2018-01-21 DIAGNOSIS — Z1389 Encounter for screening for other disorder: Secondary | ICD-10-CM | POA: Diagnosis not present

## 2018-01-21 DIAGNOSIS — J069 Acute upper respiratory infection, unspecified: Secondary | ICD-10-CM | POA: Diagnosis not present

## 2018-03-08 DIAGNOSIS — G43111 Migraine with aura, intractable, with status migrainosus: Secondary | ICD-10-CM | POA: Diagnosis not present

## 2018-03-08 DIAGNOSIS — G43719 Chronic migraine without aura, intractable, without status migrainosus: Secondary | ICD-10-CM | POA: Diagnosis not present

## 2018-06-08 DIAGNOSIS — G43111 Migraine with aura, intractable, with status migrainosus: Secondary | ICD-10-CM | POA: Diagnosis not present

## 2018-06-08 DIAGNOSIS — G43719 Chronic migraine without aura, intractable, without status migrainosus: Secondary | ICD-10-CM | POA: Diagnosis not present

## 2018-11-01 ENCOUNTER — Ambulatory Visit (INDEPENDENT_AMBULATORY_CARE_PROVIDER_SITE_OTHER): Payer: PRIVATE HEALTH INSURANCE | Admitting: Internal Medicine

## 2018-11-01 ENCOUNTER — Encounter (INDEPENDENT_AMBULATORY_CARE_PROVIDER_SITE_OTHER): Payer: Self-pay | Admitting: Internal Medicine

## 2018-11-01 ENCOUNTER — Encounter (INDEPENDENT_AMBULATORY_CARE_PROVIDER_SITE_OTHER): Payer: Self-pay | Admitting: *Deleted

## 2018-11-01 VITALS — BP 114/75 | HR 63 | Temp 98.4°F | Ht 63.0 in | Wt 153.7 lb

## 2018-11-01 DIAGNOSIS — K625 Hemorrhage of anus and rectum: Secondary | ICD-10-CM

## 2018-11-01 LAB — HEMOGLOBIN AND HEMATOCRIT, BLOOD
HCT: 43.3 % (ref 35.0–45.0)
Hemoglobin: 14.7 g/dL (ref 11.7–15.5)

## 2018-11-01 NOTE — Patient Instructions (Signed)
The risks of bleeding, perforation and infection were reviewed with patient.  

## 2018-11-01 NOTE — Telephone Encounter (Signed)
error 

## 2018-11-01 NOTE — Progress Notes (Signed)
Subjective:    Patient ID: [name], female    DOB: [birth year], 38 y.o.   MRN: [id number] PCP Dr. Gerarda Fraction.  HPI Referred by Dr. Jomarie Longs for blood in stool. Has been seeing blood in her stool x 3 weeks. Blood is mixed in the stool. She says when she saw Dr Jomarie Longs, she describes as a lot of blood.She says she continues to see a small amt of blood in her stool. She has some lower abdominal discomfort thru out the day. She is having 2-3 stools a day. Usually only has one stool a day normally. Sometimes she would go 4-5 days without a BM.  Her appetite is okay. No weight loss.  No NSAIDs.  Mother has diverticulitis and colitis. No prior hx of drug abuse.  Maternal grandfather's brother had colon cancer in his 66s.   Review of Systems Past Medical History:  Diagnosis Date  . Asthma   . Asthma   . Dizziness 04/24/2015  . Elevated cholesterol   . Endometriosis   . GERD (gastroesophageal reflux disease)   . Hyperlipidemia   . Hypothyroidism   . Migraines   . Pneumonia   . PONV (postoperative nausea and vomiting)   . Thyroid disease    hypothyroid. Hashimoto's dz  . Vertigo, benign positional 04/24/2015    Past Surgical History:  Procedure Laterality Date  . BILATERAL KNEE ARTHROSCOPY    . BREAST REDUCTION SURGERY    . CARPAL TUNNEL RELEASE     both hands  . KNEE ARTHROSCOPY     bilateral  . laprscopy    . SINUS SURGERY WITH INSTATRAK    . TOTAL ABDOMINAL HYSTERECTOMY      Allergies  Allergen Reactions  . Latex     Breaking out and will affect breathing  . Benadryl [Diphenhydramine Hcl]     Increased BP, HR. Sob. Hives.   . Codeine Nausea And Vomiting  . Diclofenac Sodium   . Diclofenac Sodium   . Diphenhydramine Hcl   . Doxycycline     Increases blood pressure and respiratory rate Chest pain  . Doxycycline     increased BP, cp and hives  . Hydrocodone-Acetaminophen   . Ibuprofen   . Maxalt [Rizatriptan]     Increased BP and chest pain  . Naproxen   .  Nsaids     Nausea and vomiting  . Propoxyphene N-Acetaminophen   . Pseudoephedrine   . Relpax [Eletriptan Hydrobromide]   . Sudafed [Pseudoephedrine Hcl]     Increased BP and HR. Diarrhea.   . Vicodin [Hydrocodone-Acetaminophen]     Nausea and vomiting  . Voltaren [Diclofenac]     Nausea and vomiting     Current Outpatient Medications on File Prior to Visit  Medication Sig Dispense Refill  . cyclobenzaprine (FLEXERIL) 5 MG tablet Take 5 mg by mouth as needed for muscle spasms.    . simvastatin (ZOCOR) 10 MG tablet Take 10 mg by mouth at bedtime.      Marland Kitchen zonisamide (ZONEGRAN) 100 MG capsule Take 300 mg by mouth daily.     No current facility-administered medications on file prior to visit.         Objective:   Physical Exam Blood pressure 114/75, pulse 63, temperature 98.4 F (36.9 C), height 5\' 3"  (1.6 m), weight 153 lb 11.2 oz (69.7 kg). Alert and oriented. Skin warm and dry. Oral mucosa is moist.   . Sclera anicteric, conjunctivae is pink. Thyroid not enlarged. No cervical lymphadenopathy. Lungs  clear. Heart regular rate and rhythm.  Abdomen is soft. Bowel sounds are positive. No hepatomegaly. No abdominal masses felt. No tenderness.  No edema to lower extremities.  Stool brown and guaiac negative. No masses felt.         Assessment & Plan:  Rectal bleeding. I think she needs a colonoscopy to rule out Crohns/UC. Also colonic neoplasm needs to be ruled out.

## 2018-11-02 DIAGNOSIS — K625 Hemorrhage of anus and rectum: Secondary | ICD-10-CM | POA: Insufficient documentation

## 2018-12-08 ENCOUNTER — Other Ambulatory Visit: Payer: Self-pay

## 2018-12-08 ENCOUNTER — Encounter (HOSPITAL_COMMUNITY)
Admission: RE | Disposition: A | Discharge status: Home / Self Care | Payer: Self-pay | Source: Home / Self Care | Attending: Internal Medicine

## 2018-12-08 ENCOUNTER — Ambulatory Visit (HOSPITAL_COMMUNITY)
Admission: RE | Admit: 2018-12-08 | Discharge: 2018-12-08 | Disposition: A | Discharge status: Home / Self Care | Payer: PRIVATE HEALTH INSURANCE | Attending: Internal Medicine | Admitting: Internal Medicine

## 2018-12-08 ENCOUNTER — Encounter (HOSPITAL_COMMUNITY): Payer: Self-pay | Admitting: *Deleted

## 2018-12-08 DIAGNOSIS — Z825 Family history of asthma and other chronic lower respiratory diseases: Secondary | ICD-10-CM | POA: Diagnosis not present

## 2018-12-08 DIAGNOSIS — Z888 Allergy status to other drugs, medicaments and biological substances status: Secondary | ICD-10-CM | POA: Insufficient documentation

## 2018-12-08 DIAGNOSIS — G43909 Migraine, unspecified, not intractable, without status migrainosus: Secondary | ICD-10-CM | POA: Diagnosis not present

## 2018-12-08 DIAGNOSIS — K644 Residual hemorrhoidal skin tags: Secondary | ICD-10-CM

## 2018-12-08 DIAGNOSIS — Z803 Family history of malignant neoplasm of breast: Secondary | ICD-10-CM | POA: Insufficient documentation

## 2018-12-08 DIAGNOSIS — J45909 Unspecified asthma, uncomplicated: Secondary | ICD-10-CM | POA: Insufficient documentation

## 2018-12-08 DIAGNOSIS — Z79899 Other long term (current) drug therapy: Secondary | ICD-10-CM | POA: Insufficient documentation

## 2018-12-08 DIAGNOSIS — E7800 Pure hypercholesterolemia, unspecified: Secondary | ICD-10-CM | POA: Diagnosis not present

## 2018-12-08 DIAGNOSIS — E785 Hyperlipidemia, unspecified: Secondary | ICD-10-CM | POA: Diagnosis not present

## 2018-12-08 DIAGNOSIS — R197 Diarrhea, unspecified: Secondary | ICD-10-CM | POA: Insufficient documentation

## 2018-12-08 DIAGNOSIS — Z881 Allergy status to other antibiotic agents status: Secondary | ICD-10-CM | POA: Insufficient documentation

## 2018-12-08 DIAGNOSIS — Z886 Allergy status to analgesic agent status: Secondary | ICD-10-CM | POA: Insufficient documentation

## 2018-12-08 DIAGNOSIS — E063 Autoimmune thyroiditis: Secondary | ICD-10-CM | POA: Insufficient documentation

## 2018-12-08 DIAGNOSIS — Z8249 Family history of ischemic heart disease and other diseases of the circulatory system: Secondary | ICD-10-CM | POA: Diagnosis not present

## 2018-12-08 DIAGNOSIS — K6289 Other specified diseases of anus and rectum: Secondary | ICD-10-CM

## 2018-12-08 DIAGNOSIS — Z90710 Acquired absence of both cervix and uterus: Secondary | ICD-10-CM | POA: Diagnosis not present

## 2018-12-08 DIAGNOSIS — K625 Hemorrhage of anus and rectum: Secondary | ICD-10-CM | POA: Insufficient documentation

## 2018-12-08 DIAGNOSIS — Z833 Family history of diabetes mellitus: Secondary | ICD-10-CM | POA: Diagnosis not present

## 2018-12-08 DIAGNOSIS — Z885 Allergy status to narcotic agent status: Secondary | ICD-10-CM | POA: Insufficient documentation

## 2018-12-08 DIAGNOSIS — Z820 Family history of epilepsy and other diseases of the nervous system: Secondary | ICD-10-CM | POA: Insufficient documentation

## 2018-12-08 HISTORY — PX: BIOPSY: SHX5522

## 2018-12-08 HISTORY — PX: COLONOSCOPY: SHX5424

## 2018-12-08 SURGERY — COLONOSCOPY
Anesthesia: Moderate Sedation

## 2018-12-08 MED ORDER — MIDAZOLAM HCL 5 MG/5ML IJ SOLN
INTRAMUSCULAR | Status: AC
  Filled 2018-12-08: qty 10

## 2018-12-08 MED ORDER — SODIUM CHLORIDE 0.9 % IV SOLN
INTRAVENOUS | Status: DC
  Administered 2018-12-08: 07:00:00 via INTRAVENOUS

## 2018-12-08 MED ORDER — MEPERIDINE HCL 50 MG/ML IJ SOLN
INTRAMUSCULAR | Status: DC | PRN
  Administered 2018-12-08: 25 mg via INTRAVENOUS
  Administered 2018-12-08: 10 mg via INTRAVENOUS
  Administered 2018-12-08: 15 mg via INTRAVENOUS

## 2018-12-08 MED ORDER — MEPERIDINE HCL 50 MG/ML IJ SOLN
INTRAMUSCULAR | Status: AC
  Filled 2018-12-08: qty 1

## 2018-12-08 MED ORDER — STERILE WATER FOR IRRIGATION IR SOLN
Status: DC | PRN
  Administered 2018-12-08: 08:00:00

## 2018-12-08 MED ORDER — MIDAZOLAM HCL 5 MG/5ML IJ SOLN
INTRAMUSCULAR | Status: DC | PRN
  Administered 2018-12-08 (×5): 2 mg via INTRAVENOUS

## 2018-12-08 MED ORDER — DICYCLOMINE HCL 10 MG PO CAPS: 10.0000 mg | ORAL_CAPSULE | Freq: Two times a day (BID) | ORAL | 5 refills | Status: DC

## 2018-12-08 MED ORDER — HYDROCORTISONE ACETATE 25 MG RE SUPP: 25.0000 mg | Freq: Every day | RECTAL | 0 refills | Status: DC

## 2018-12-08 NOTE — H&P (Signed)
[name] is an 38 y.o. female.   Chief Complaint: Patient is here for colonoscopy. HPI: Patient is 38 year old Caucasian female who presents with 8-week history of rectal bleeding lower abdominal pain and change in consistency of her stools.  When she was seen in the office about 5 weeks ago she was having rectal bleeding daily but now it is not every day.  Cramping is primarily across lower abdomen.  She has had off and on during the daytime.  She had her hemoglobin checked after the office visit and hemoglobin was normal at 14.7.  No history of recent antibiotic use or travel outside the country.  She does not take OTC NSAIDs.  Her appetite is good and her weight has been stable. Family history is negative for CRC.  Mother has some type of colitis.  Past Medical History:  Diagnosis Date  .    Marland Kitchen Asthma     04/24/2015  . Elevated cholesterol   . Endometriosis   . GERD (gastroesophageal reflux disease)   . Hyperlipidemia   . Hypothyroidism   . Migraines   . Pneumonia   . PONV (postoperative nausea and vomiting)   . Thyroid disease    hypothyroid. Hashimoto's dz  . Vertigo, benign positional 04/24/2015    Past Surgical History:  Procedure Laterality Date  . BILATERAL KNEE ARTHROSCOPY    . BREAST REDUCTION SURGERY    . CARPAL TUNNEL RELEASE     both hands  . KNEE ARTHROSCOPY     bilateral  . laprscopy    . SINUS SURGERY WITH INSTATRAK    . TOTAL ABDOMINAL HYSTERECTOMY      Family History  Problem Relation Age of Onset  . COPD Mother   . Diabetes Mother   . Thyroid disease Mother   . Aneurysm Father   . Hypertension Father   . Diabetes Brother   . Diabetes Maternal Grandmother   . Heart disease Maternal Grandmother   . Other Maternal Grandmother        a fib  . Parkinson's disease Maternal Grandfather   . Diabetes Paternal Grandmother   . Hypertension Paternal Grandmother   . Breast cancer Paternal Grandmother   . Hypertension Paternal Grandfather   . Heart attack  Paternal Grandfather   . Aneurysm Paternal Grandfather   . Alzheimer's disease Paternal Grandfather   . Sarcoidosis Sister    Social History:  reports that she has never smoked. She has never used smokeless tobacco. She reports that she does not drink alcohol or use drugs.  Allergies:  Allergies  Allergen Reactions  . Latex     Breaking out and will affect breathing  . Benadryl [Diphenhydramine Hcl]     Increased BP, HR. Sob. Hives.   . Codeine Nausea And Vomiting  . Diclofenac Sodium   . Diclofenac Sodium   . Diphenhydramine Hcl   . Doxycycline     Increases blood pressure and respiratory rate Chest pain  . Doxycycline     increased BP, cp and hives  . Hydrocodone-Acetaminophen   . Ibuprofen   . Maxalt [Rizatriptan]     Increased BP and chest pain  . Naproxen   . Nsaids     Nausea and vomiting  . Propoxyphene N-Acetaminophen   . Pseudoephedrine   . Relpax [Eletriptan Hydrobromide]   . Sudafed [Pseudoephedrine Hcl]     Increased BP and HR. Diarrhea.   . Vicodin [Hydrocodone-Acetaminophen]     Nausea and vomiting  . Voltaren [Diclofenac]  Nausea and vomiting     Medications Prior to Admission  Medication Sig Dispense Refill  . cyclobenzaprine (FLEXERIL) 5 MG tablet Take 5 mg by mouth 2 (two) times daily as needed (headaches).     . simvastatin (ZOCOR) 10 MG tablet Take 10 mg by mouth at bedtime.      Marland Kitchen zonisamide (ZONEGRAN) 100 MG capsule Take 300 mg by mouth at bedtime.       No results found for this or any previous visit (from the past 48 hour(s)). No results found.  ROS  Blood pressure 128/86, pulse 72, temperature 98.6 F (37 C), temperature source Oral, resp. rate 14, height 5\' 3"  (1.6 m), weight 69.7 kg, SpO2 100 %. Physical Exam  Constitutional: She appears well-developed and well-nourished.  HENT:  Mouth/Throat: Oropharynx is clear and moist.  Eyes: Conjunctivae are normal. No scleral icterus.  Neck: No thyromegaly present.  Cardiovascular:  Normal rate, regular rhythm and normal heart sounds.  No murmur heard. Respiratory: Effort normal and breath sounds normal.  GI:  Abdomen is symmetrical with Pfannenstiel scar.  Abdomen is soft.  She has mild tenderness at hypogastrium, LLQ and LUQ.  No organomegaly or masses.  Musculoskeletal:        General: No edema.  Lymphadenopathy:    She has no cervical adenopathy.  Neurological: She is alert.  Skin: Skin is warm and dry.     Assessment/Plan Rectal bleeding and diarrhea. Diagnostic colonoscopy.  Hildred Laser, MD 12/08/2018, 7:30 AM

## 2018-12-08 NOTE — Discharge Instructions (Signed)
No aspirin or NSAIDs for 24 hours.   Resume usual medications as before. Dicyclomine 10 mg by mouth before breakfast and lunch daily.   Resume usual diet. No driving for 24 hours. Physician will call with biopsy results.  PATIENT INSTRUCTIONS POST-ANESTHESIA  IMMEDIATELY FOLLOWING SURGERY:  Do not drive or operate machinery for the first twenty four hours after surgery.  Do not make any important decisions for twenty four hours after surgery or while taking narcotic pain medications or sedatives.  If you develop intractable nausea and vomiting or a severe headache please notify your doctor immediately.  FOLLOW-UP:  Please make an appointment with your surgeon as instructed. You do not need to follow up with anesthesia unless specifically instructed to do so.  WOUND CARE INSTRUCTIONS (if applicable):  Keep a dry clean dressing on the anesthesia/puncture wound site if there is drainage.  Once the wound has quit draining you may leave it open to air.  Generally you should leave the bandage intact for twenty four hours unless there is drainage.  If the epidural site drains for more than 36-48 hours please call the anesthesia department.  QUESTIONS?:  Please feel free to call your physician or the hospital operator if you have any questions, and they will be happy to assist you.      Colonoscopy, Adult, Care After This sheet gives you information about how to care for yourself after your procedure. Your doctor may also give you more specific instructions. If you have problems or questions, call your doctor. What can I expect after the procedure? After the procedure, it is common to have:  A small amount of blood in your poop for 24 hours.  Some gas.  Mild cramping or bloating in your belly. Follow these instructions at home: General instructions  For the first 24 hours after the procedure: ? Do not drive or use machinery. ? Do not sign important documents. ? Do not drink alcohol. ? Do  your daily activities more slowly than normal. ? Eat foods that are soft and easy to digest.  Take over-the-counter or prescription medicines only as told by your doctor. To help cramping and bloating:   Try walking around.  Put heat on your belly (abdomen) as told by your doctor. Use a heat source that your doctor recommends, such as a moist heat pack or a heating pad. ? Put a towel between your skin and the heat source. ? Leave the heat on for 20-30 minutes. ? Remove the heat if your skin turns bright red. This is especially important if you cannot feel pain, heat, or cold. You can get burned. Eating and drinking   Drink enough fluid to keep your pee (urine) clear or pale yellow.  Return to your normal diet as told by your doctor. Avoid heavy or fried foods that are hard to digest.  Avoid drinking alcohol for as long as told by your doctor. Contact a doctor if:  You have blood in your poop (stool) 2-3 days after the procedure. Get help right away if:  You have more than a small amount of blood in your poop.  You see large clumps of tissue (blood clots) in your poop.  Your belly is swollen.  You feel sick to your stomach (nauseous).  You throw up (vomit).  You have a fever.  You have belly pain that gets worse, and medicine does not help your pain. Summary  After the procedure, it is common to have a small amount  of blood in your poop. You may also have mild cramping and bloating in your belly.  For the first 24 hours after the procedure, do not drive or use machinery, do not sign important documents, and do not drink alcohol.  Get help right away if you have a lot of blood in your poop, feel sick to your stomach, have a fever, or have more belly pain. This information is not intended to replace advice given to you by your health care provider. Make sure you discuss any questions you have with your health care provider. Document Released: 09/27/2010 Document Revised:  06/25/2017 Document Reviewed: 05/19/2016 Elsevier Interactive Patient Education  2019 [employer]    Hemorrhoids Hemorrhoids are swollen veins that may develop:  In the butt (rectum). These are called internal hemorrhoids.  Around the opening of the butt (anus). These are called external hemorrhoids. Hemorrhoids can cause pain, itching, or bleeding. Most of the time, they do not cause serious problems. They usually get better with diet changes, lifestyle changes, and other home treatments. What are the causes? This condition may be caused by:  Having trouble pooping (constipation).  Pushing hard (straining) to poop.  Watery poop (diarrhea).  Pregnancy.  Being very overweight (obese).  Sitting for long periods of time.  Heavy lifting or other activity that causes you to strain.  Anal sex.  Riding a bike for a long period of time. What are the signs or symptoms? Symptoms of this condition include:  Pain.  Itching or soreness in the butt.  Bleeding from the butt.  Leaking poop.  Swelling in the area.  One or more lumps around the opening of your butt. How is this diagnosed? A doctor can often diagnose this condition by looking at the affected area. The doctor may also:  Do an exam that involves feeling the area with a gloved hand (digital rectal exam).  Examine the area inside your butt using a small tube (anoscope).  Order blood tests. This may be done if you have lost a lot of blood.  Have you get a test that involves looking inside the colon using a flexible tube with a camera on the end (sigmoidoscopy or colonoscopy). How is this treated? This condition can usually be treated at home. Your doctor may tell you to change what you eat, make lifestyle changes, or try home treatments. If these do not help, procedures can be done to remove the hemorrhoids or make them smaller. These may involve:  Placing rubber bands at the base of the hemorrhoids to cut off  their blood supply.  Injecting medicine into the hemorrhoids to shrink them.  Shining a type of light energy onto the hemorrhoids to cause them to fall off.  Doing surgery to remove the hemorrhoids or cut off their blood supply. Follow these instructions at home: Eating and drinking   Eat foods that have a lot of fiber in them. These include whole grains, beans, nuts, fruits, and vegetables.  Ask your doctor about taking products that have added fiber (fibersupplements).  Reduce the amount of fat in your diet. You can do this by: ? Eating low-fat dairy products. ? Eating less red meat. ? Avoiding processed foods.  Drink enough fluid to keep your pee (urine) pale yellow. Managing pain and swelling   Take a warm-water bath (sitz bath) for 20 minutes to ease pain. Do this 3-4 times a day. You may do this in a bathtub or using a portable sitz bath that fits  over the toilet.  If told, put ice on the painful area. It may be helpful to use ice between your warm baths. ? Put ice in a plastic bag. ? Place a towel between your skin and the bag. ? Leave the ice on for 20 minutes, 2-3 times a day. General instructions  Take over-the-counter and prescription medicines only as told by your doctor. ? Medicated creams and medicines may be used as told.  Exercise often. Ask your doctor how much and what kind of exercise is best for you.  Go to the bathroom when you have the urge to poop. Do not wait.  Avoid pushing too hard when you poop.  Keep your butt dry and clean. Use wet toilet paper or moist towelettes after pooping.  Do not sit on the toilet for a long time.  Keep all follow-up visits as told by your doctor. This is important. Contact a doctor if you:  Have pain and swelling that do not get better with treatment or medicine.  Have trouble pooping.  Cannot poop.  Have pain or swelling outside the area of the hemorrhoids. Get help right away if you have:  Bleeding that  will not stop. Summary  Hemorrhoids are swollen veins in the butt or around the opening of the butt.  They can cause pain, itching, or bleeding.  Eat foods that have a lot of fiber in them. These include whole grains, beans, nuts, fruits, and vegetables.  Take a warm-water bath (sitz bath) for 20 minutes to ease pain. Do this 3-4 times a day. This information is not intended to replace advice given to you by your health care provider. Make sure you discuss any questions you have with your health care provider. Document Released: 06/03/2008 Document Revised: 01/14/2018 Document Reviewed: 01/14/2018 Elsevier Interactive Patient Education  2019 [employer].

## 2018-12-08 NOTE — Op Note (Signed)
[facility] Patient Name: [name] Procedure Date: 12/08/2018 7:11 AM MRN: [id number] Date of Birth: May 08, 1981 Attending MD: Hildred Laser , MD CSN: [id number] Age: 38 Admit Type: Outpatient Procedure:                Colonoscopy Indications:              Rectal bleeding, Diarrhea Providers:                Hildred Laser, MD, Otis Peak B. Sharon Seller, RN, Raphael Gibney, Aram Candela Referring MD:             Redmond School, MD Medicines:                Meperidine 50 mg IV, Midazolam 10 mg IV Complications:            No immediate complications. Estimated Blood Loss:     Estimated blood loss: none. Estimated blood loss                            was minimal. Procedure:                Pre-Anesthesia Assessment:                           - Prior to the procedure, a History and Physical                            was performed, and patient medications and                            allergies were reviewed. The patient's tolerance of                            previous anesthesia was also reviewed. The risks                            and benefits of the procedure and the sedation                            options and risks were discussed with the patient.                            All questions were answered, and informed consent                            was obtained. Prior Anticoagulants: The patient has                            taken no previous anticoagulant or antiplatelet                            agents. ASA Grade Assessment: II - A patient with  mild systemic disease. After reviewing the risks                            and benefits, the patient was deemed in                            satisfactory condition to undergo the procedure.                           After obtaining informed consent, the colonoscope                            was passed under direct vision. Throughout the                            procedure,  the patient's blood pressure, pulse, and                            oxygen saturations were monitored continuously. The                            [id number]-[id number] ([id number]) scope was introduced through                            the anus and advanced to the the terminal ileum,                            with identification of the appendiceal orifice and                            IC valve. The colonoscopy was somewhat difficult                            due to restricted mobility of the colon. The                            patient tolerated the procedure well. The quality                            of the bowel preparation was excellent. The                            terminal ileum, ileocecal valve, appendiceal                            orifice, and rectum were photographed. Scope In: 7:48:04 AM Scope Out: 8:09:21 AM Scope Withdrawal Time: 0 hours 11 minutes 32 seconds  Total Procedure Duration: 0 hours 21 minutes 17 seconds  Findings:      The perianal and digital rectal examinations were normal.      The terminal ileum appeared normal.      The proximal rectum, mid rectum, recto-sigmoid colon, sigmoid colon,       descending colon, splenic flexure, transverse colon, hepatic flexure,       ascending colon,  cecum, appendiceal orifice and ileocecal valve appeared       normal.      An area of mildly congested mucosa was found in the distal rectum. This       was biopsied with a cold forceps for histology.      External hemorrhoids were found during retroflexion. The hemorrhoids       were small. Impression:               - The examined portion of the ileum was normal.                           - The proximal rectum, mid rectum, recto-sigmoid                            colon, sigmoid colon, descending colon, splenic                            flexure, transverse colon, hepatic flexure,                            ascending colon, cecum, appendiceal orifice and                             ileocecal valve are normal.                           - Congested erythematous mucosa in the distal                            rectum. Biopsied.                           - External hemorrhoids.                           Comment: distal rectal mucosal changes suggest mild                            proctitis. Moderate Sedation:      Moderate (conscious) sedation was administered by the endoscopy nurse       and supervised by the endoscopist. The following parameters were       monitored: oxygen saturation, heart rate, blood pressure, CO2       capnography and response to care. Total physician intraservice time was       31 minutes. Recommendation:           - Patient has a contact number available for                            emergencies. The signs and symptoms of potential                            delayed complications were discussed with the                            patient. Return to normal activities tomorrow.  Written discharge instructions were provided to the                            patient.                           - Resume previous diet today.                           - Continue present medications.                           - No aspirin, ibuprofen, naproxen, or other                            non-steroidal anti-inflammatory drugs for 1 day.                           - Use Bentyl (dicyclomine) 10 mg PO BID 30 min AC.                           - Anusol (pramoxine) HC suppository: Insert                            rectally daily.                           - Await pathology results. Procedure Code(s):        --- Professional ---                           [phone], Colonoscopy, flexible; with biopsy, single                            or multiple                           99153, Moderate sedation; each additional 15                            minutes intraservice time                           G0500, Moderate sedation services provided by the                             same physician or other qualified health care                            professional performing a gastrointestinal                            endoscopic service that sedation supports,                            requiring the presence of an independent trained  observer to assist in the monitoring of the                            patient's level of consciousness and physiological                            status; initial 15 minutes of intra-service time;                            patient age 59 years or older (additional time may                            be reported with [phone], as appropriate) Diagnosis Code(s):        --- Professional ---                           K64.4, Residual hemorrhoidal skin tags                           K62.89, Other specified diseases of anus and rectum                           K62.5, Hemorrhage of anus and rectum                           R19.7, Diarrhea, unspecified CPT copyright 2019 American [employer] Association. All rights reserved. The codes documented in this report are preliminary and upon coder review may  be revised to meet current compliance requirements. Hildred Laser, MD Hildred Laser, MD 12/08/2018 8:25:42 AM This report has been signed electronically. Number of Addenda: 0

## 2018-12-13 ENCOUNTER — Encounter (HOSPITAL_COMMUNITY): Payer: Self-pay | Admitting: Internal Medicine

## 2019-03-05 ENCOUNTER — Encounter (HOSPITAL_COMMUNITY): Payer: Self-pay | Admitting: Emergency Medicine

## 2019-03-05 ENCOUNTER — Emergency Department (HOSPITAL_COMMUNITY): Payer: PRIVATE HEALTH INSURANCE

## 2019-03-05 ENCOUNTER — Other Ambulatory Visit: Payer: Self-pay

## 2019-03-05 ENCOUNTER — Emergency Department (HOSPITAL_COMMUNITY)
Admission: EM | Admit: 2019-03-05 | Discharge: 2019-03-05 | Disposition: A | Discharge status: Home / Self Care | Payer: PRIVATE HEALTH INSURANCE | Attending: Emergency Medicine | Admitting: Emergency Medicine

## 2019-03-05 DIAGNOSIS — M79661 Pain in right lower leg: Secondary | ICD-10-CM | POA: Insufficient documentation

## 2019-03-05 DIAGNOSIS — R51 Headache: Secondary | ICD-10-CM | POA: Diagnosis not present

## 2019-03-05 DIAGNOSIS — Z79899 Other long term (current) drug therapy: Secondary | ICD-10-CM | POA: Insufficient documentation

## 2019-03-05 DIAGNOSIS — R102 Pelvic and perineal pain: Secondary | ICD-10-CM | POA: Insufficient documentation

## 2019-03-05 DIAGNOSIS — Y999 Unspecified external cause status: Secondary | ICD-10-CM | POA: Insufficient documentation

## 2019-03-05 DIAGNOSIS — J45909 Unspecified asthma, uncomplicated: Secondary | ICD-10-CM | POA: Insufficient documentation

## 2019-03-05 DIAGNOSIS — M79662 Pain in left lower leg: Secondary | ICD-10-CM | POA: Diagnosis not present

## 2019-03-05 DIAGNOSIS — E039 Hypothyroidism, unspecified: Secondary | ICD-10-CM | POA: Insufficient documentation

## 2019-03-05 DIAGNOSIS — Y93I9 Activity, other involving external motion: Secondary | ICD-10-CM | POA: Diagnosis not present

## 2019-03-05 DIAGNOSIS — Y929 Unspecified place or not applicable: Secondary | ICD-10-CM | POA: Diagnosis not present

## 2019-03-05 DIAGNOSIS — M542 Cervicalgia: Secondary | ICD-10-CM | POA: Insufficient documentation

## 2019-03-05 LAB — COMPREHENSIVE METABOLIC PANEL
ALT: 22 U/L (ref 0–44)
AST: 24 U/L (ref 15–41)
Albumin: 4.5 g/dL (ref 3.5–5.0)
Alkaline Phosphatase: 82 U/L (ref 38–126)
Anion gap: 10 (ref 5–15)
BUN: 15 mg/dL (ref 6–20)
CO2: 27 mmol/L (ref 22–32)
Calcium: 9.2 mg/dL (ref 8.9–10.3)
Chloride: 103 mmol/L (ref 98–111)
Creatinine, Ser: 0.96 mg/dL (ref 0.44–1.00)
GFR calc Af Amer: >60 mL/min (ref >60)
GFR calc non Af Amer: >60 mL/min (ref >60)
Glucose, Bld: 93 mg/dL (ref 70–99)
Potassium: 3.5 mmol/L (ref 3.5–5.1)
Sodium: 140 mmol/L (ref 135–145)
Total Bilirubin: 0.7 mg/dL (ref 0.3–1.2)
Total Protein: 7 g/dL (ref 6.5–8.1)

## 2019-03-05 LAB — CBC WITH DIFFERENTIAL/PLATELET
Abs Immature Granulocytes: 0.06 10*3/uL (ref 0.00–0.07)
Basophils Absolute: 0.1 10*3/uL (ref 0.0–0.1)
Basophils Relative: 1 %
Eosinophils Absolute: 0.3 10*3/uL (ref 0.0–0.5)
Eosinophils Relative: 3 %
HCT: 44.7 % (ref 36.0–46.0)
Hemoglobin: 14.8 g/dL (ref 12.0–15.0)
Immature Granulocytes: 1 %
Lymphocytes Relative: 36 %
Lymphs Abs: 3.4 10*3/uL (ref 0.7–4.0)
MCH: 30.4 pg (ref 26.0–34.0)
MCHC: 33.1 g/dL (ref 30.0–36.0)
MCV: 91.8 fL (ref 80.0–100.0)
Monocytes Absolute: 0.8 10*3/uL (ref 0.1–1.0)
Monocytes Relative: 9 %
Neutro Abs: 4.9 10*3/uL (ref 1.7–7.7)
Neutrophils Relative %: 50 %
Platelets: 205 10*3/uL (ref 150–400)
RBC: 4.87 MIL/uL (ref 3.87–5.11)
RDW: 11.7 % (ref 11.5–15.5)
WBC: 9.4 10*3/uL (ref 4.0–10.5)
nRBC: 0 % (ref 0.0–0.2)

## 2019-03-05 MED ORDER — METHOCARBAMOL 500 MG PO TABS: 500.0000 mg | ORAL_TABLET | Freq: Two times a day (BID) | ORAL | 0 refills | Status: AC

## 2019-03-05 MED ORDER — IOHEXOL 300 MG/ML  SOLN
100.0000 mL | Freq: Once | INTRAMUSCULAR | Status: AC | PRN
  Administered 2019-03-05: 100 mL via INTRAVENOUS

## 2019-03-05 MED ORDER — SODIUM CHLORIDE 0.9 % IV BOLUS
500.0000 mL | Freq: Once | INTRAVENOUS | Status: AC
  Administered 2019-03-05: 500 mL via INTRAVENOUS
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
## 2019-03-05 NOTE — Discharge Instructions (Addendum)
May alternate ibuprofen or Tylenol to help with your pain. I have prescribed muscle relaxers for your pain, please do not drink or drive while taking this medications as they can make you drowsy.  Please follow-up with Primary care physician as needed.

## 2019-05-20 ENCOUNTER — Ambulatory Visit (INDEPENDENT_AMBULATORY_CARE_PROVIDER_SITE_OTHER): Payer: PRIVATE HEALTH INSURANCE

## 2019-05-20 ENCOUNTER — Other Ambulatory Visit: Payer: Self-pay

## 2019-05-20 ENCOUNTER — Ambulatory Visit
Admission: EM | Admit: 2019-05-20 | Discharge: 2019-05-20 | Disposition: A | Discharge status: Home / Self Care | Payer: PRIVATE HEALTH INSURANCE

## 2019-05-20 DIAGNOSIS — S93491A Sprain of other ligament of right ankle, initial encounter: Secondary | ICD-10-CM

## 2019-05-20 DIAGNOSIS — M79671 Pain in right foot: Secondary | ICD-10-CM | POA: Diagnosis not present

## 2019-05-20 DIAGNOSIS — M25571 Pain in right ankle and joints of right foot: Secondary | ICD-10-CM

## 2019-05-20 MED ORDER — PREDNISONE 20 MG PO TABS: 20.0000 mg | ORAL_TABLET | Freq: Two times a day (BID) | ORAL | 0 refills | Status: AC

## 2019-05-20 NOTE — Discharge Instructions (Signed)
X-rays did not show fracture or dislocation Ankle brace applied Continue conservative management of rest, ice, and gentle stretches Prednisone prescribed.  Take as needed for pain and swelling Follow up with PCP or orthopedist if symptoms persist Return or go to the ER if you have any new or worsening symptoms (fever, chills, chest pain, increased swelling, pain, symptoms do not improve with medications/ brace, etc...)

## 2019-05-20 NOTE — ED Triage Notes (Signed)
Pt rolled right ankle while walking, pain in right side of foot , some swelling noted

## 2019-05-20 NOTE — ED Provider Notes (Signed)
[place]   [id number] 05/20/19 Arrival Time: 1429  CC: RT ankle/ foot injury  SUBJECTIVE: History from: patient. [name] is a 38 y.o. female complains of right ankle/ foot pain began 3 days ago.  Symptoms began after she rolled her ankle while walking.  Localizes the pain to the outside of ankle.  Describes the pain as intermittent and dull, 5/10.  Has tried OTC medications without relief.  Symptoms are made worse with walking.  Reports similar symptoms in the past, but denies fracture. Complains of  Associated swelling. Denies fever, chills, erythema, ecchymosis, weakness, numbness and tingling.  ROS: As per HPI.  All other pertinent ROS negative.     Past Medical History:  Diagnosis Date  . Asthma   . Asthma   . Dizziness 04/24/2015  . Elevated cholesterol   . Endometriosis   . GERD (gastroesophageal reflux disease)   . Hyperlipidemia   . Hypothyroidism   . Migraines   . Pneumonia   . PONV (postoperative nausea and vomiting)   . Thyroid disease    hypothyroid. Hashimoto's dz  . Vertigo, benign positional 04/24/2015   Past Surgical History:  Procedure Laterality Date  . BILATERAL KNEE ARTHROSCOPY    . BIOPSY  12/08/2018   Procedure: BIOPSY;  Surgeon: Rogene Houston, MD;  Location: [facility];  Service: Endoscopy;;  rectal  . BREAST REDUCTION SURGERY    . CARPAL TUNNEL RELEASE     both hands  . COLONOSCOPY N/A 12/08/2018   Procedure: COLONOSCOPY;  Surgeon: Rogene Houston, MD;  Location: [facility];  Service: Endoscopy;  Laterality: N/A;  2:00  . KNEE ARTHROSCOPY     bilateral  . laprscopy    . SINUS SURGERY WITH INSTATRAK    . TOTAL ABDOMINAL HYSTERECTOMY     Allergies  Allergen Reactions  . Latex     Breaking out and will affect breathing  . Benadryl [Diphenhydramine Hcl]     Increased BP, HR. Sob. Hives.   . Codeine Nausea And Vomiting  . Diclofenac Sodium   . Diclofenac Sodium   . Diphenhydramine Hcl   . Doxycycline     Increases  blood pressure and respiratory rate Chest pain  . Doxycycline     increased BP, cp and hives  . Hydrocodone-Acetaminophen   . Ibuprofen   . Maxalt [Rizatriptan]     Increased BP and chest pain  . Naproxen   . Nsaids     Nausea and vomiting  . Propoxyphene N-Acetaminophen   . Pseudoephedrine   . Relpax [Eletriptan Hydrobromide]   . Sudafed [Pseudoephedrine Hcl]     Increased BP and HR. Diarrhea.   . Vicodin [Hydrocodone-Acetaminophen]     Nausea and vomiting  . Voltaren [Diclofenac]     Nausea and vomiting    No current facility-administered medications on file prior to encounter.    Current Outpatient Medications on File Prior to Encounter  Medication Sig Dispense Refill  . baclofen (LIORESAL) 10 MG tablet Take 10 mg by mouth daily as needed.    . dicyclomine (BENTYL) 10 MG capsule Take 1 capsule (10 mg total) by mouth 2 (two) times daily before a meal. 60 capsule 5  . hydrocortisone (ANUSOL-HC) 25 MG suppository Place 1 suppository (25 mg total) rectally at bedtime. 14 suppository 0  . propranolol (INDERAL) 20 MG tablet Take 40 mg by mouth daily.    . simvastatin (ZOCOR) 10 MG tablet Take 10 mg by mouth at bedtime.      . [  DISCONTINUED] zonisamide (ZONEGRAN) 100 MG capsule Take 300 mg by mouth at bedtime.      Social History   Socioeconomic History  . Marital status: Widowed    Spouse name: Not on file  . Number of children: 1  . Years of education: 39  . Highest education level: Not on file  Occupational History  . Occupation: Cashier/Cook  Social Needs  . Financial resource strain: Not on file  . Food insecurity    Worry: Not on file    Inability: Not on file  . Transportation needs    Medical: Not on file    Non-medical: Not on file  Tobacco Use  . Smoking status: Never Smoker  . Smokeless tobacco: Never Used  Substance and Sexual Activity  . Alcohol use: No  . Drug use: No  . Sexual activity: Yes    Birth control/protection: Surgical    Comment: hyst   Lifestyle  . Physical activity    Days per week: Not on file    Minutes per session: Not on file  . Stress: Not on file  Relationships  . Social Herbalist on phone: Not on file    Gets together: Not on file    Attends religious service: Not on file    Active member of club or organization: Not on file    Attends meetings of clubs or organizations: Not on file    Relationship status: Not on file  . Intimate partner violence    Fear of current or ex partner: Not on file    Emotionally abused: Not on file    Physically abused: Not on file    Forced sexual activity: Not on file  Other Topics Concern  . Not on file  Social History Narrative   ** Merged History Encounter **       Lives at home with husband and daughter. Right-handed. 1 cup caffeine daily.   Family History  Problem Relation Age of Onset  . COPD Mother   . Diabetes Mother   . Thyroid disease Mother   . Aneurysm Father   . Hypertension Father   . Diabetes Brother   . Diabetes Maternal Grandmother   . Heart disease Maternal Grandmother   . Other Maternal Grandmother        a fib  . Parkinson's disease Maternal Grandfather   . Diabetes Paternal Grandmother   . Hypertension Paternal Grandmother   . Breast cancer Paternal Grandmother   . Hypertension Paternal Grandfather   . Heart attack Paternal Grandfather   . Aneurysm Paternal Grandfather   . Alzheimer's disease Paternal Grandfather   . Sarcoidosis Sister     OBJECTIVE:  Vitals:   05/20/19 1444  BP: 114/75  Pulse: (!) 57  Resp: 20  Temp: 98.6 F (37 C)  SpO2: 97%    General appearance: ALERT; in no acute distress.  Head: NCAT Lungs: Normal respiratory effort CV: dorsalis pedis pulses 2+. Cap refill < 2 seconds Musculoskeletal: Right ankle/ foot Inspection: Mild swelling over the lateral ankle Palpation: diffusely TTP over lateral ankle ROM: FROM active and passive Strength: 5/5 dorsiflexion, 5/5 plantar flexion Skin: warm and  dry Neurologic: Ambulates with minimal difficulty; Sensation intact about the lower extremities Psychological: alert and cooperative; normal mood and affect  DIAGNOSTIC STUDIES:  Dg Ankle Complete Right  Result Date: 05/20/2019 CLINICAL DATA:  Right ankle pain since a twisting injury 4 days ago. EXAM: RIGHT ANKLE - COMPLETE 3+ VIEW COMPARISON:  None.  FINDINGS: There is no evidence of fracture, dislocation, or joint effusion. There is no evidence of arthropathy or other focal bone abnormality. Soft tissues are unremarkable. IMPRESSION: Negative. Electronically Signed   By: Lorriane Shire M.D.   On: 05/20/2019 15:27   Dg Foot Complete Right  Result Date: 05/20/2019 CLINICAL DATA:  Lateral right foot pain since a twisting injury 4 days ago. Ankle pain. EXAM: RIGHT FOOT COMPLETE - 3+ VIEW COMPARISON:  None. FINDINGS: There is no evidence of fracture or dislocation. There is no evidence of arthropathy or other focal bone abnormality. Soft tissues are unremarkable. IMPRESSION: Negative. Electronically Signed   By: Lorriane Shire M.D.   On: 05/20/2019 15:26    My interpretation:  X-rays negative for bony abnormalities including fracture, or dislocation.  No soft tissue swelling.    I have reviewed the x-rays myself and the radiologist interpretation. I am in agreement with the radiologist interpretation.     ASSESSMENT & PLAN:  1. Sprain of anterior talofibular ligament of right ankle, initial encounter   2. Pain in joint involving right ankle and foot     Meds ordered this encounter  Medications  . predniSONE (DELTASONE) 20 MG tablet    Sig: Take 1 tablet (20 mg total) by mouth 2 (two) times daily with a meal for 5 days.    Dispense:  10 tablet    Refill:  0    Order Specific Question:   Supervising Provider    Answer:   Raylene Everts [id number]   X-rays did not show fracture or dislocation Ankle brace applied Continue conservative management of rest, ice, and gentle stretches  Prednisone prescribed.  Take as needed for pain and swelling Follow up with PCP or orthopedist if symptoms persist Return or go to the ER if you have any new or worsening symptoms (fever, chills, chest pain, increased swelling, pain, symptoms do not improve with medications/ brace, etc...)   Reviewed expectations re: course of current medical issues. Questions answered. Outlined signs and symptoms indicating need for more acute intervention. Patient verbalized understanding. After Visit Summary given.    Lestine Box, PA-C 05/20/19 1558

## 2019-06-21 ENCOUNTER — Other Ambulatory Visit (INDEPENDENT_AMBULATORY_CARE_PROVIDER_SITE_OTHER): Payer: Self-pay | Admitting: Internal Medicine

## 2019-07-11 ENCOUNTER — Other Ambulatory Visit: Payer: Self-pay

## 2019-07-11 ENCOUNTER — Other Ambulatory Visit (HOSPITAL_COMMUNITY): Payer: Self-pay | Admitting: Internal Medicine

## 2019-07-11 ENCOUNTER — Encounter (HOSPITAL_COMMUNITY): Payer: Self-pay

## 2019-07-11 ENCOUNTER — Ambulatory Visit (HOSPITAL_COMMUNITY)
Admission: RE | Admit: 2019-07-11 | Discharge: 2019-07-11 | Disposition: A | Discharge status: Home / Self Care | Payer: PRIVATE HEALTH INSURANCE | Source: Ambulatory Visit | Attending: Internal Medicine | Admitting: Internal Medicine

## 2019-07-11 DIAGNOSIS — Z1231 Encounter for screening mammogram for malignant neoplasm of breast: Secondary | ICD-10-CM

## 2019-09-06 ENCOUNTER — Encounter (INDEPENDENT_AMBULATORY_CARE_PROVIDER_SITE_OTHER): Payer: Self-pay | Admitting: Internal Medicine

## 2019-09-06 ENCOUNTER — Ambulatory Visit (INDEPENDENT_AMBULATORY_CARE_PROVIDER_SITE_OTHER): Payer: PRIVATE HEALTH INSURANCE | Admitting: Internal Medicine

## 2019-09-06 ENCOUNTER — Other Ambulatory Visit: Payer: Self-pay

## 2019-09-06 DIAGNOSIS — K582 Mixed irritable bowel syndrome: Secondary | ICD-10-CM

## 2019-09-06 DIAGNOSIS — K589 Irritable bowel syndrome without diarrhea: Secondary | ICD-10-CM | POA: Insufficient documentation

## 2019-09-06 MED ORDER — DICYCLOMINE HCL 10 MG PO CAPS: 10.0000 mg | ORAL_CAPSULE | Freq: Two times a day (BID) | ORAL | 5 refills | Status: DC | PRN

## 2019-09-06 NOTE — Progress Notes (Signed)
Virtual Visit via Telephone Note  Patient had scheduled face-to-face visit today.  Visit was changed to virtual/telephone visit because of ongoing Covid-19 pandemic.  Patient agreed. I connected with [name] on 09/06/19 at  3:16 PM EST by telephone and verified that I am speaking with the correct person using two identifiers.  Location: Patient: office Provider: office   I discussed the limitations, risks, security and privacy concerns of performing an evaluation and management service by telephone and the availability of in person appointments. I also discussed with the patient that there may be a patient responsible charge related to this service. The patient expressed understanding and agreed to proceed.   History of Present Illness:  Patient is 38 year old Caucasian female who was last seen in the office in February 2020 for rectal bleeding abdominal pain and diarrhea.  She underwent colonoscopy in April 2020.  She had congested rectal mucosa and biopsy was unremarkable.  Rest of the colon was normal.  Patient was begun on dicyclomine. This is some 27-month follow-up. Patient states she is doing well.  She will need new prescription for dicyclomine.  She is using it on as-needed basis and she feels it is helped.  While on dicyclomine most of her stools are formed.  Now she is having loose to semiformed stools 2-3 times a day.  She is still having periods where she would not go to the bathroom for 2 to 3 days and feels constipated.  It is only during these episodes that she notices blood with a bowel movement which is small in amount.  She is not experiencing any side effects with dicyclomine.  She has very good appetite.  She is worried about weight gain that she aches experience.  She remains on high-fiber diet.  Since her February 2020 visit she has gained 7 pounds. Patient says she is on a study drug for migraine which she uses on as-needed basis. Current Outpatient Medications   Medication Sig Dispense Refill  . AMBULATORY NON FORMULARY MEDICATION Medication Name: Vazegepant - patient uses 1 spray to each nostril (alternating nostrils) Patient is currently in a clinical study - [facility] in [place].    . baclofen (LIORESAL) 10 MG tablet Take 10 mg by mouth daily as needed.    . propranolol (INDERAL) 20 MG tablet Take 40 mg by mouth daily.    . simvastatin (ZOCOR) 10 MG tablet Take 10 mg by mouth at bedtime.      . dicyclomine (BENTYL) 10 MG capsule Take 1 capsule (10 mg total) by mouth 2 (two) times daily as needed. 60 capsule 5   No current facility-administered medications for this visit.     Observations/Objective:  Patient reported her weight to be 160 pounds. Weight was 153 pounds on 11/01/2018.  Assessment and Plan:  #1.  Irritable bowel syndrome.  She has both diarrhea and constipation but diarrhea occurs much more frequently than constipation.  Dicyclomine is helping.  Therefore prescription will be refilled.  Since patient is having sporadic constipation she should continue with high-fiber diet.  She can also use glycerin or Dulcolax suppository if she goes more than 1 day without a bowel movement and has an urge and unable to go.  #2.  History of hematochezia felt to be secondary to hemorrhoids.  She had colonoscopy in April 2020 which did not reveal any changes of inflammatory bowel disease or polyps.  She bleeds only when she is constipated.  Follow Up Instructions:   Patient advised  to continue high-fiber diet. New prescription for dicyclomine 10 mg p.o. twice daily as needed sent to her pharmacy for 60 doses and 5 refills. Office visit in 1 year. I discussed the assessment and treatment plan with the patient. The patient was provided an opportunity to ask questions and all were answered. The patient agreed with the plan and demonstrated an understanding of the instructions.   The patient was advised to call back or seek an  in-person evaluation if the symptoms worsen or if the condition fails to improve as anticipated.  I provided 8 minutes of non-face-to-face time during this encounter.   Hildred Laser, MD

## 2019-12-19 DIAGNOSIS — Z1389 Encounter for screening for other disorder: Secondary | ICD-10-CM | POA: Diagnosis not present

## 2019-12-19 DIAGNOSIS — R635 Abnormal weight gain: Secondary | ICD-10-CM | POA: Diagnosis not present

## 2019-12-19 DIAGNOSIS — Z6833 Body mass index (BMI) 33.0-33.9, adult: Secondary | ICD-10-CM | POA: Diagnosis not present

## 2019-12-19 DIAGNOSIS — E6609 Other obesity due to excess calories: Secondary | ICD-10-CM | POA: Diagnosis not present

## 2019-12-19 DIAGNOSIS — R238 Other skin changes: Secondary | ICD-10-CM | POA: Diagnosis not present

## 2020-07-03 ENCOUNTER — Other Ambulatory Visit (HOSPITAL_COMMUNITY): Payer: Self-pay | Admitting: Internal Medicine

## 2020-07-03 DIAGNOSIS — Z1231 Encounter for screening mammogram for malignant neoplasm of breast: Secondary | ICD-10-CM

## 2020-07-09 ENCOUNTER — Encounter (HOSPITAL_COMMUNITY): Payer: PRIVATE HEALTH INSURANCE

## 2020-07-09 DIAGNOSIS — Z1231 Encounter for screening mammogram for malignant neoplasm of breast: Secondary | ICD-10-CM

## 2020-07-12 ENCOUNTER — Ambulatory Visit (HOSPITAL_COMMUNITY)
Admission: RE | Admit: 2020-07-12 | Discharge: 2020-07-12 | Disposition: A | Discharge status: Home / Self Care | Payer: BC Managed Care – PPO | Source: Ambulatory Visit | Attending: Internal Medicine | Admitting: Internal Medicine

## 2020-07-12 ENCOUNTER — Other Ambulatory Visit: Payer: Self-pay

## 2020-07-12 DIAGNOSIS — Z1231 Encounter for screening mammogram for malignant neoplasm of breast: Secondary | ICD-10-CM | POA: Insufficient documentation

## 2020-09-04 ENCOUNTER — Ambulatory Visit (INDEPENDENT_AMBULATORY_CARE_PROVIDER_SITE_OTHER): Payer: PRIVATE HEALTH INSURANCE | Admitting: Internal Medicine

## 2020-11-05 ENCOUNTER — Ambulatory Visit (INDEPENDENT_AMBULATORY_CARE_PROVIDER_SITE_OTHER): Payer: BC Managed Care – PPO | Admitting: Gastroenterology

## 2021-01-03 ENCOUNTER — Telehealth (INDEPENDENT_AMBULATORY_CARE_PROVIDER_SITE_OTHER): Payer: 59 | Admitting: Gastroenterology

## 2021-01-03 ENCOUNTER — Encounter (INDEPENDENT_AMBULATORY_CARE_PROVIDER_SITE_OTHER): Payer: Self-pay | Admitting: Gastroenterology

## 2021-01-03 ENCOUNTER — Other Ambulatory Visit: Payer: Self-pay

## 2021-01-03 VITALS — BP 126/87 | Ht 63.0 in | Wt 209.0 lb

## 2021-01-03 DIAGNOSIS — K580 Irritable bowel syndrome with diarrhea: Secondary | ICD-10-CM

## 2021-01-03 DIAGNOSIS — K644 Residual hemorrhoidal skin tags: Secondary | ICD-10-CM

## 2021-01-03 MED ORDER — DICYCLOMINE HCL 10 MG PO CAPS: 10.0000 mg | ORAL_CAPSULE | Freq: Two times a day (BID) | ORAL | 11 refills | Status: AC | PRN

## 2021-01-03 NOTE — Patient Instructions (Addendum)
Restart dicyclomine every 12 hours as needed for abdominal pain Explained presumed etiology of IBS symptoms. Patient was counseled about the benefit of implementing a low FODMAP to improve symptoms and recurrent episodes. A dietary list was provided to the patient. Also, the patient was counseled about the benefit of avoiding stressing situations and potential environmental triggers leading to symptomatology. If rectal bleeding becomes frequent and uncomfortable, please call back to referring to general surgeon for hemorrhoidectomy

## 2021-01-03 NOTE — Progress Notes (Signed)
[name], M.D. Gastroenterology & Hepatology [facility] [address] [place], [place] [address] Primary Care Physician: [facility] [employer], [place] [id number]  This is a virtual video visit.  It required patient-provider interaction for the medical decision making as documented below. The patient has consented and agreed to proceed with a Telehealth encounter given the current Coronavirus pandemic.  VIRTUAL VISIT NOTE Patient location: Home Provider location: Office  I will communicate my assessment and recommendations to the referring MD via EMR. Note: Occasional unusual wording and randomly placed punctuation marks may result from the use of speech recognition technology to transcribe this document"  Problems: 1. IBS-M  History of Present Illness: [name] is a 40 y.o. female with PMH IBS-M, asthma, GERD, hyperlipidemia, hypothyroidism, who presents for follow up of IBS.  The patient was last seen on 09/06/2019. At that time, the patient had her dicyclomine refilled.  She reports that she has presented some occasional episodes of abdominal pain in the lower part of her abdomen. She states that certain foods trigger her symptoms, such as ice cream and food that has tomato or seasoned food. She ran out of her dicyclomine, which she believes helped her in the past.  Reports that when she has the pain episodes she feels very uncomfortable. She is still having occasional episodes of blood in her stool, which is intermittent, possibly every 5-6 weeks. No dyschezia. She usually has 2 loose bowel movements per day. The patient denies having any nausea, vomiting, fever,  melena, hematemesis, diarrhea, jaundice, pruritus or weight loss.  Last EGD: 2012 - Normal appearing esophageal mucosa without  evidence of erosive esophagitis or Barrett's esophagus. Two small hyperplastic appearing polyps at gastric  body which were left alone. Nonerosive antral gastritis. Last Colonoscopy: 12/2018 -normal terminal ileum, there was presence of erythematous mucosa in the rectum (biopsies were unremarkable), external hemorrhoids  Past Medical History: Past Medical History:  Diagnosis Date  . Asthma   . Asthma   . Dizziness 04/24/2015  . Elevated cholesterol   . Endometriosis   . GERD (gastroesophageal reflux disease)   . Hyperlipidemia   . Hypothyroidism   . Migraines   . Pneumonia   . PONV (postoperative nausea and vomiting)   . Thyroid disease    hypothyroid. Hashimoto's dz  . Vertigo, benign positional 04/24/2015    Past Surgical History: Past Surgical History:  Procedure Laterality Date  . BILATERAL KNEE ARTHROSCOPY    . BIOPSY  12/08/2018   Procedure: BIOPSY;  Surgeon: Rogene Houston, MD;  Location: [facility];  Service: Endoscopy;;  rectal  . BREAST REDUCTION SURGERY    . CARPAL TUNNEL RELEASE     both hands  . COLONOSCOPY N/A 12/08/2018   Procedure: COLONOSCOPY;  Surgeon: Rogene Houston, MD;  Location: [facility];  Service: Endoscopy;  Laterality: N/A;  2:00  . KNEE ARTHROSCOPY     bilateral  . laprscopy    . REDUCTION MAMMAPLASTY    . SINUS SURGERY WITH INSTATRAK    . TOTAL ABDOMINAL HYSTERECTOMY      Family History: Family History  Problem Relation Age of Onset  . COPD Mother   . Diabetes Mother   . Thyroid disease Mother   . Aneurysm Father   . Hypertension Father   . Diabetes Brother   . Diabetes Maternal Grandmother   . Heart disease Maternal Grandmother   . Other Maternal Grandmother  a fib  . Parkinson's disease Maternal Grandfather   . Diabetes Paternal Grandmother   . Hypertension Paternal Grandmother   . Breast cancer Paternal Grandmother   . Hypertension Paternal Grandfather   . Heart attack Paternal Grandfather   . Aneurysm Paternal Grandfather   . Alzheimer's disease Paternal Grandfather   . Sarcoidosis Sister     Social History: Social  History   Tobacco Use  Smoking Status Never Smoker  Smokeless Tobacco Never Used   Social History   Substance and Sexual Activity  Alcohol Use No   Social History   Substance and Sexual Activity  Drug Use No    Allergies: Allergies  Allergen Reactions  . Latex     Breaking out and will affect breathing  . Benadryl [Diphenhydramine Hcl]     Increased BP, HR. Sob. Hives.   . Codeine Nausea And Vomiting  . Diclofenac Sodium   . Diclofenac Sodium   . Diphenhydramine Hcl   . Doxycycline     Increases blood pressure and respiratory rate Chest pain  . Doxycycline     increased BP, cp and hives  . Hydrocodone-Acetaminophen   . Ibuprofen   . Maxalt [Rizatriptan]     Increased BP and chest pain  . Naproxen   . Nsaids     Nausea and vomiting  . Propoxyphene N-Acetaminophen   . Pseudoephedrine   . Relpax [Eletriptan Hydrobromide]   . Sudafed [Pseudoephedrine Hcl]     Increased BP and HR. Diarrhea.   . Vicodin [Hydrocodone-Acetaminophen]     Nausea and vomiting  . Voltaren [Diclofenac]     Nausea and vomiting     Medications: Current Outpatient Medications  Medication Sig Dispense Refill  . albuterol (VENTOLIN HFA) 108 (90 Base) MCG/ACT inhaler Inhale 2 puffs into the lungs every 4 (four) hours as needed for wheezing or shortness of breath.    . fluticasone (FLONASE) 50 MCG/ACT nasal spray Place 2 sprays into both nostrils daily.    Marland Kitchen levocetirizine (XYZAL) 5 MG tablet Take 5 mg by mouth every evening.    . AMBULATORY NON FORMULARY MEDICATION Medication Name: Vazegepant - patient uses 1 spray to each nostril (alternating nostrils) Patient is currently in a clinical study - [facility] in [place]. (Patient not taking: Reported on 01/03/2021)    . baclofen (LIORESAL) 10 MG tablet Take 10 mg by mouth daily as needed. (Patient not taking: Reported on 01/03/2021)    . dicyclomine (BENTYL) 10 MG capsule Take 1 capsule (10 mg total) by mouth 2 (two) times daily  as needed. (Patient not taking: Reported on 01/03/2021) 60 capsule 5  . simvastatin (ZOCOR) 10 MG tablet Take 10 mg by mouth at bedtime.   (Patient not taking: Reported on 01/03/2021)     No current facility-administered medications for this visit.    Review of Systems: GENERAL: negative for malaise, night sweats HEENT: No changes in hearing or vision, no nose bleeds or other nasal problems. NECK: Negative for lumps, goiter, pain and significant neck swelling RESPIRATORY: Negative for cough, wheezing CARDIOVASCULAR: Negative for chest pain, leg swelling, palpitations, orthopnea GI: SEE HPI MUSCULOSKELETAL: Negative for joint pain or swelling, back pain, and muscle pain. SKIN: Negative for lesions, rash PSYCH: Negative for sleep disturbance, mood disorder and recent psychosocial stressors. HEMATOLOGY Negative for prolonged bleeding, bruising easily, and swollen nodes. ENDOCRINE: Negative for cold or heat intolerance, polyuria, polydipsia and goiter. NEURO: negative for tremor, gait imbalance, syncope and seizures. The remainder of the review of  systems is noncontributory.   Physical Exam: GENERAL: The patient is AO x3, in no acute distress. HEENT: Head is normocephalic and atraumatic. EOMI are intact. Mouth is well hydrated and without lesions. LUNGS: Adequate chest expansion. Auscultation could not be performed remotely. HEART: Auscultation could not be performed remotely. ABDOMEN: Nondistended. No masses were observed. EXTREMITIES: Without any cyanosis, clubbing, rash, lesions or edema. NEUROLOGIC: AOx3, no focal motor deficit. SKIN: no jaundice, no rashes  Imaging/Labs: as above  I personally reviewed and interpreted the available labs, imaging and endoscopic files.  Impression and Plan: TANETTA FUHRIMAN is a 40 y.o. female with PMH IBS-M, asthma, GERD, hyperlipidemia, hypothyroidism, who presents for follow up of IBS.  Patient still having episodes of abdominal pain, which I  consider are part of the symptoms of her irritable bowel syndrome.  Has not presented any red flag signs.  Had a colonoscopy that was negative for any acute alteration.  I will prescribe again dicyclomine to improve her symptoms.  She will also benefit from implementing a low FODMAP diet.  Finally, the patient stated although she has rectal bleeding this is not very frequent.  As the etiology of her bleeding is likely coming from external hemorrhoids, I advised her that if she presents worsening bleeding she will require an evaluation by general surgeon, which she understood but would like to hold off on it for now.  - Restart dicyclomine every 12 hours as needed for abdominal pain - Explained presumed etiology of IBS symptoms. Patient was counseled about the benefit of implementing a low FODMAP to improve symptoms and recurrent episodes. A dietary list was provided to the patient. Also, the patient was counseled about the benefit of avoiding stressing situations and potential environmental triggers leading to symptomatology. - If rectal bleeding becomes frequent and uncomfortable, patient needs to call to refer to general surgeon for hemorrhoidectomy  All questions were answered.      Face-to-face time: I spent a total of  25 minutes  Kristi Peppers, MD Gastroenterology and Hepatology [facility]

## 2021-01-24 ENCOUNTER — Encounter (INDEPENDENT_AMBULATORY_CARE_PROVIDER_SITE_OTHER): Payer: Self-pay | Admitting: Gastroenterology

## 2021-08-12 ENCOUNTER — Ambulatory Visit (HOSPITAL_COMMUNITY)
Admission: RE | Admit: 2021-08-12 | Discharge: 2021-08-12 | Disposition: A | Discharge status: Home / Self Care | Payer: 59 | Source: Ambulatory Visit | Attending: Emergency Medicine | Admitting: Emergency Medicine

## 2021-08-12 ENCOUNTER — Other Ambulatory Visit: Payer: Self-pay

## 2021-08-12 ENCOUNTER — Other Ambulatory Visit (HOSPITAL_COMMUNITY): Payer: Self-pay | Admitting: Emergency Medicine

## 2021-08-12 DIAGNOSIS — Z1231 Encounter for screening mammogram for malignant neoplasm of breast: Secondary | ICD-10-CM | POA: Insufficient documentation

## 2022-01-02 ENCOUNTER — Ambulatory Visit (INDEPENDENT_AMBULATORY_CARE_PROVIDER_SITE_OTHER): Payer: BC Managed Care – PPO | Admitting: Gastroenterology

## 2022-08-18 ENCOUNTER — Other Ambulatory Visit (HOSPITAL_COMMUNITY): Payer: Self-pay | Admitting: Family Medicine

## 2022-08-18 ENCOUNTER — Ambulatory Visit (HOSPITAL_COMMUNITY)
Admission: RE | Admit: 2022-08-18 | Discharge: 2022-08-18 | Disposition: A | Discharge status: Home / Self Care | Payer: 59 | Source: Ambulatory Visit | Attending: Family Medicine | Admitting: Family Medicine

## 2022-08-18 DIAGNOSIS — Z1231 Encounter for screening mammogram for malignant neoplasm of breast: Secondary | ICD-10-CM | POA: Diagnosis present

## 2023-04-01 IMAGING — MG MM DIGITAL SCREENING BILAT W/ TOMO AND CAD
8 series · 8 of 24 positions shown · non-contrast
Comparison: Previous exam(s).

CLINICAL DATA: Screening.

EXAM:
DIGITAL SCREENING BILATERAL MAMMOGRAM WITH TOMOSYNTHESIS AND CAD
TECHNIQUE: Bilateral screening digital craniocaudal and mediolateral oblique
mammograms were obtained. Bilateral screening digital breast
tomosynthesis was performed. The images were evaluated with
computer-aided detection.

[R CC synth-2D]
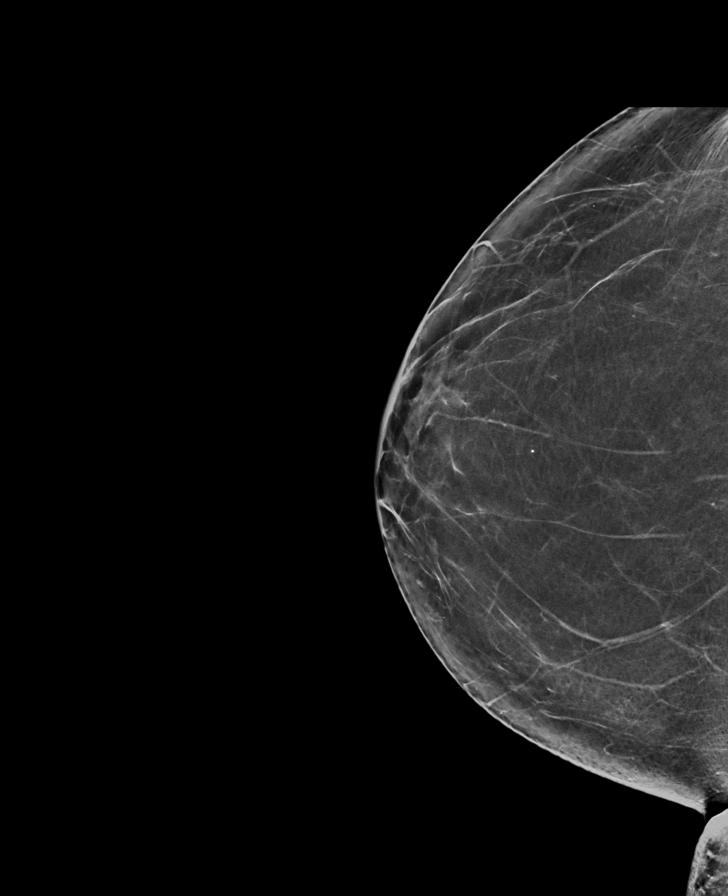

[L CC synth-2D]
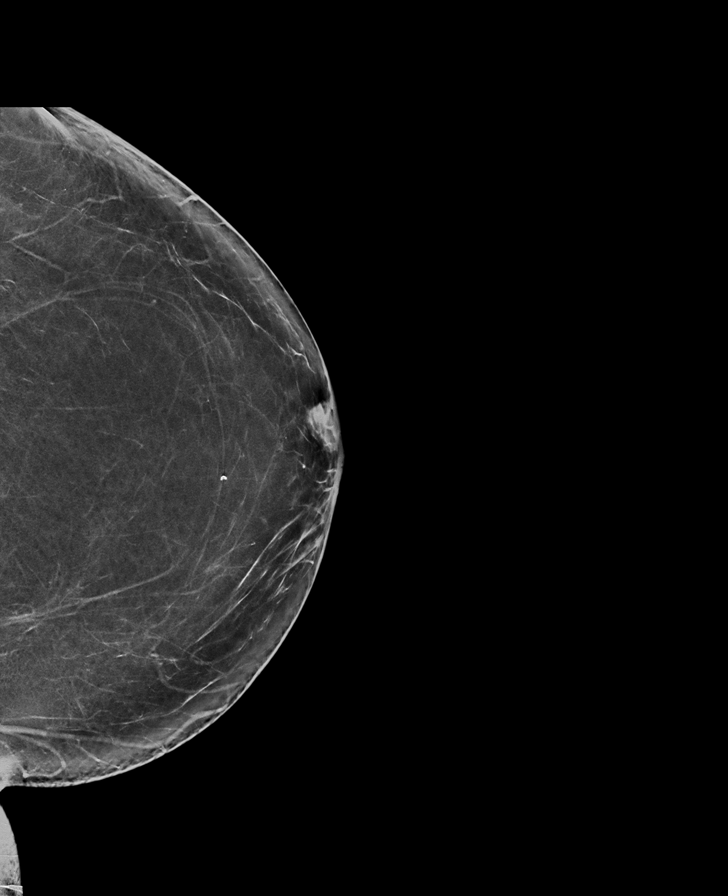

[L MLO synth-2D]
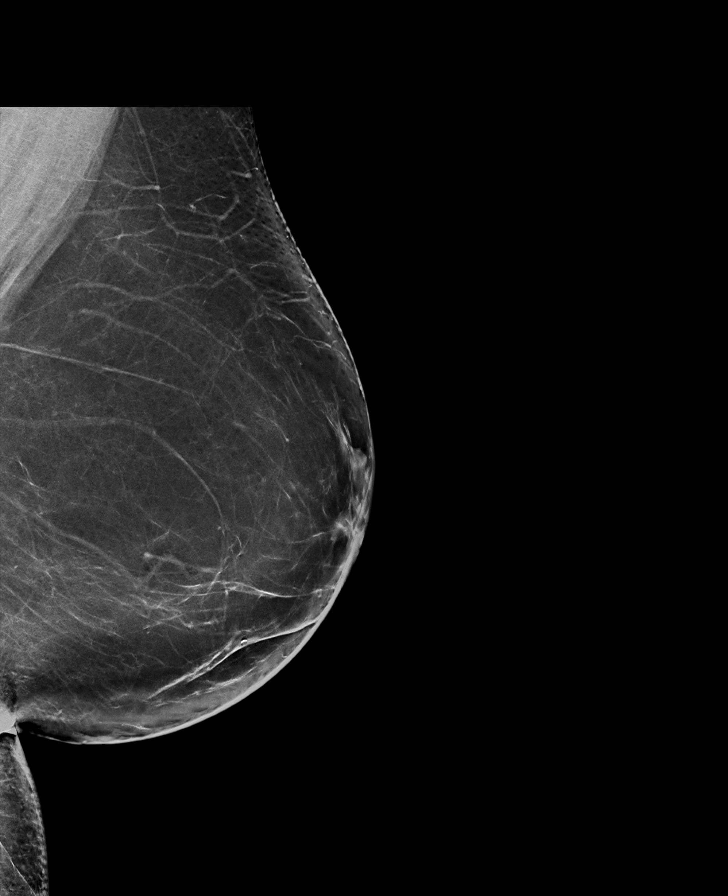

[R MLO synth-2D]
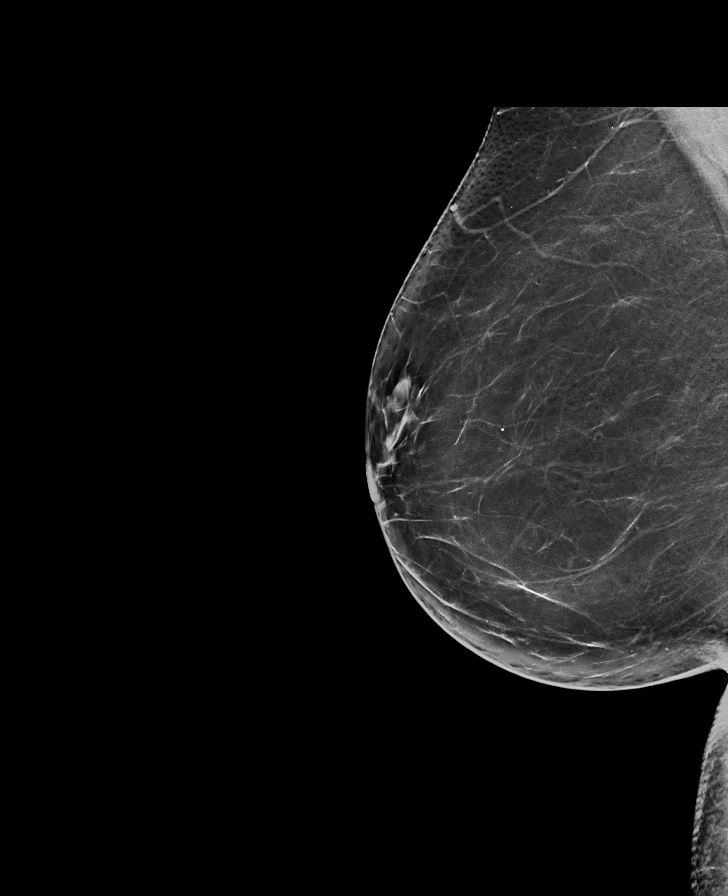

[L MLO tomo · tomo slice 43/84.0]
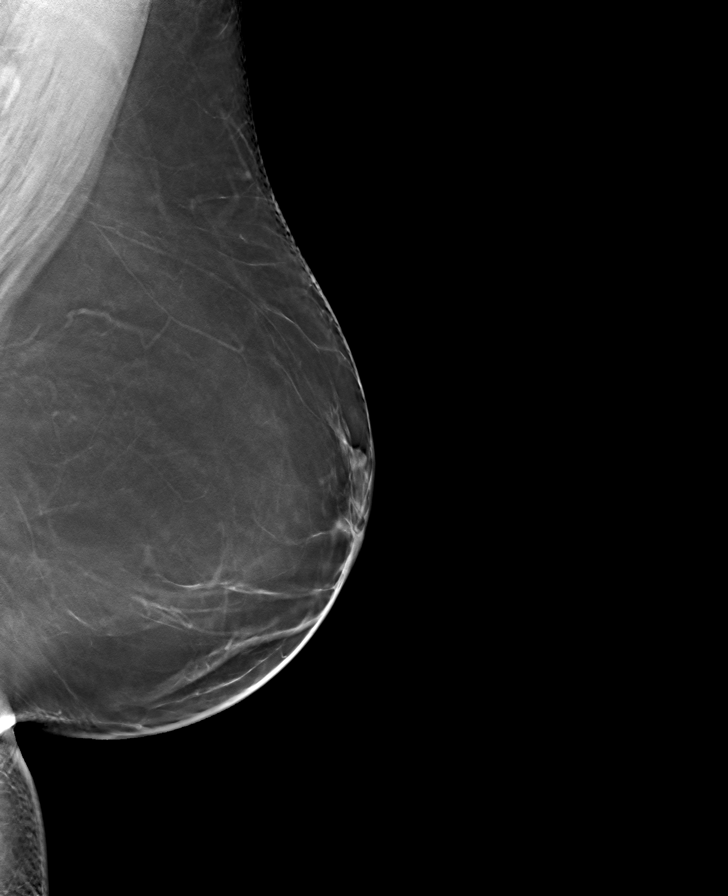

[R CC tomo · tomo slice 35/68.0]
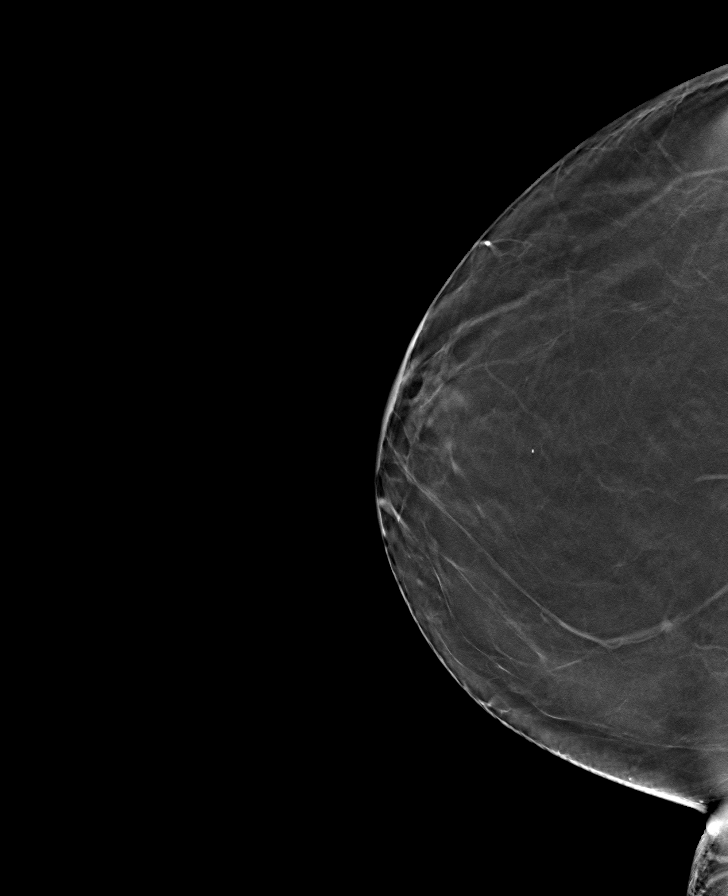

[L CC tomo · tomo slice 37/74.0]
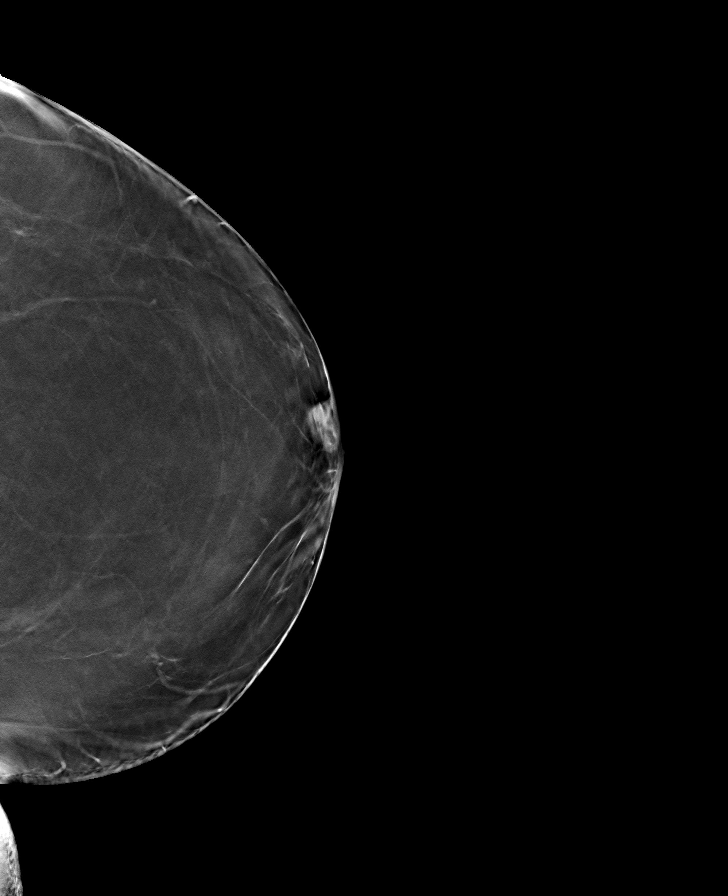

[R MLO tomo · tomo slice 41/80.0]
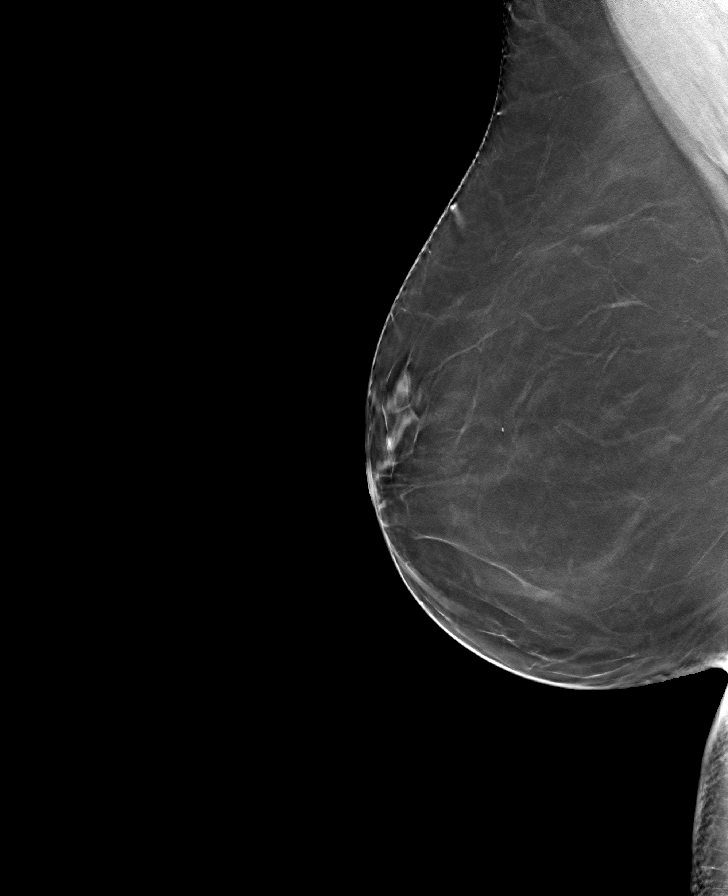

[8 of 24 positions shown; findings below may reference images not displayed]

ACR Breast Density Category b: There are scattered areas of
fibroglandular density.
FINDINGS: There are no findings suspicious for malignancy.
IMPRESSION: No mammographic evidence of malignancy. A result letter of this
screening mammogram will be mailed directly to the patient.

RECOMMENDATION:
Screening mammogram in one year. (Code:51-O-LD2)

BI-RADS CATEGORY  1: Negative.

## 2023-08-18 ENCOUNTER — Other Ambulatory Visit (HOSPITAL_COMMUNITY): Payer: Self-pay | Admitting: Family Medicine

## 2023-08-18 DIAGNOSIS — Z1231 Encounter for screening mammogram for malignant neoplasm of breast: Secondary | ICD-10-CM

## 2023-08-20 ENCOUNTER — Ambulatory Visit (HOSPITAL_COMMUNITY)
Admission: RE | Admit: 2023-08-20 | Discharge: 2023-08-20 | Disposition: A | Discharge status: Home / Self Care | Payer: 59 | Source: Ambulatory Visit | Attending: Family Medicine | Admitting: Family Medicine

## 2023-08-20 ENCOUNTER — Inpatient Hospital Stay (HOSPITAL_COMMUNITY): Admission: RE | Admit: 2023-08-20 | Payer: 59 | Source: Ambulatory Visit

## 2023-08-20 ENCOUNTER — Encounter (HOSPITAL_COMMUNITY): Payer: Self-pay

## 2023-08-20 DIAGNOSIS — Z1231 Encounter for screening mammogram for malignant neoplasm of breast: Secondary | ICD-10-CM | POA: Insufficient documentation

## 2024-09-06 ENCOUNTER — Other Ambulatory Visit (HOSPITAL_COMMUNITY): Payer: Self-pay | Admitting: Family Medicine

## 2024-09-06 DIAGNOSIS — Z1231 Encounter for screening mammogram for malignant neoplasm of breast: Secondary | ICD-10-CM

## 2024-09-12 ENCOUNTER — Encounter (HOSPITAL_COMMUNITY): Payer: Self-pay

## 2024-09-12 ENCOUNTER — Ambulatory Visit (HOSPITAL_COMMUNITY)
Admission: RE | Admit: 2024-09-12 | Discharge: 2024-09-12 | Disposition: A | Discharge status: Home / Self Care | Source: Ambulatory Visit

## 2024-09-12 ENCOUNTER — Other Ambulatory Visit: Payer: Self-pay | Admitting: Medical Genetics

## 2024-09-12 DIAGNOSIS — Z1231 Encounter for screening mammogram for malignant neoplasm of breast: Secondary | ICD-10-CM | POA: Insufficient documentation

## 2024-09-26 ENCOUNTER — Other Ambulatory Visit (HOSPITAL_COMMUNITY)
Admission: RE | Admit: 2024-09-26 | Discharge: 2024-09-26 | Disposition: A | Discharge status: Home / Self Care | Payer: Self-pay | Source: Ambulatory Visit | Attending: Oncology | Admitting: Oncology

## 2024-10-04 LAB — GENECONNECT MOLECULAR SCREEN: Genetic Analysis Overall Interpretation: NEGATIVE
